# Patient Record
Sex: Female | Born: 1980 | Race: White | Hispanic: No | Smoking: Never smoker
Health system: Southern US, Community
[De-identification: ages and names within clinical notes are randomized; demographics above are authoritative.]

## PROBLEM LIST (undated history)

## (undated) DIAGNOSIS — T8859XA Other complications of anesthesia, initial encounter: Secondary | ICD-10-CM

## (undated) DIAGNOSIS — O021 Missed abortion: Secondary | ICD-10-CM

## (undated) DIAGNOSIS — O99345 Other mental disorders complicating the puerperium: Secondary | ICD-10-CM

## (undated) DIAGNOSIS — T4145XA Adverse effect of unspecified anesthetic, initial encounter: Secondary | ICD-10-CM

## (undated) DIAGNOSIS — Z789 Other specified health status: Secondary | ICD-10-CM

## (undated) DIAGNOSIS — F418 Other specified anxiety disorders: Secondary | ICD-10-CM

---

## 2010-12-14 ENCOUNTER — Other Ambulatory Visit: Payer: Self-pay | Admitting: Specialist

## 2010-12-14 ENCOUNTER — Ambulatory Visit
Admission: RE | Admit: 2010-12-14 | Discharge: 2010-12-14 | Disposition: A | Discharge status: Home / Self Care | Payer: Self-pay | Source: Ambulatory Visit | Attending: Specialist | Admitting: Specialist

## 2010-12-14 DIAGNOSIS — R6889 Other general symptoms and signs: Secondary | ICD-10-CM

## 2012-02-07 IMAGING — CR DG CHEST 1V
1 series · 1 of 1 positions shown · non-contrast
Comparison: None.

CLINICAL DATA: Positive TB test.

CHEST - 1 VIEW

[view not recorded]
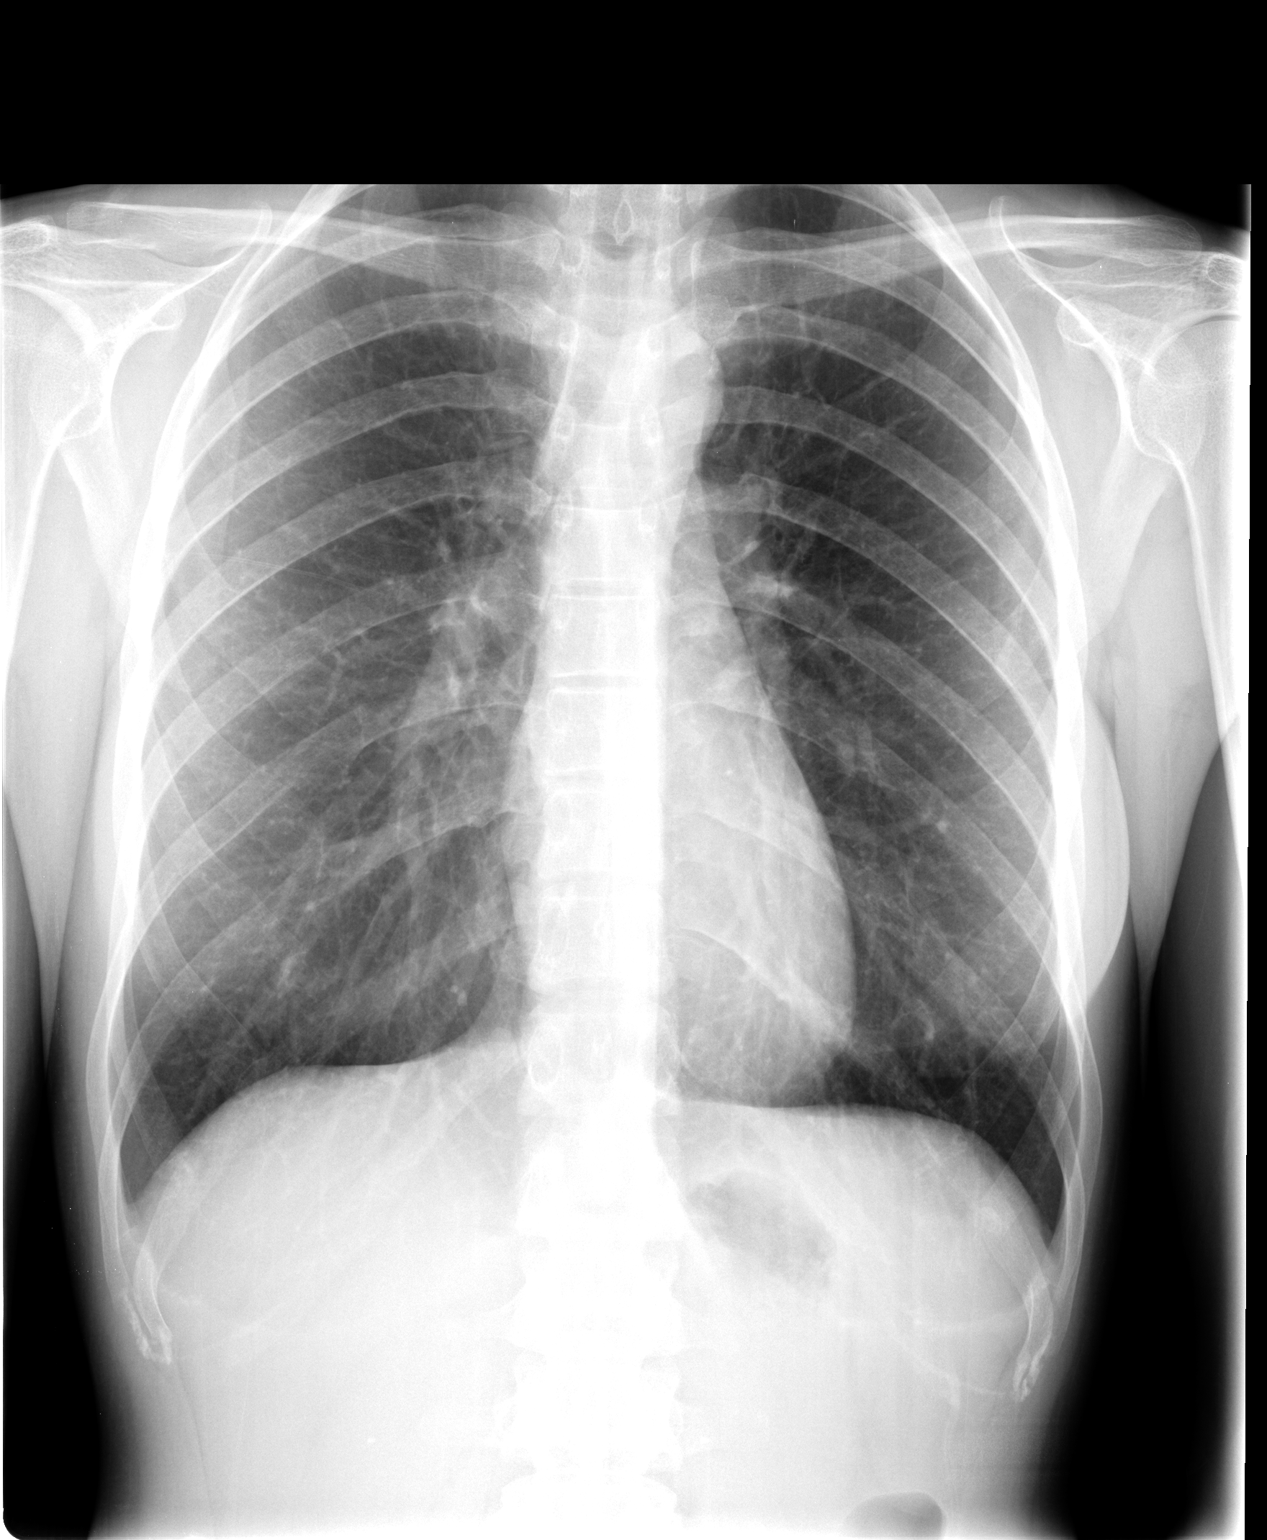

[1 of 1 positions shown; findings below may reference images not displayed]

FINDINGS: Trachea is midline.  Heart size normal.  Lungs are clear.
No pleural fluid.
IMPRESSION: No acute findings.  No evidence of active tuberculosis.

## 2013-04-15 DIAGNOSIS — Z01419 Encounter for gynecological examination (general) (routine) without abnormal findings: Secondary | ICD-10-CM | POA: Insufficient documentation

## 2013-10-28 NOTE — H&P (Signed)
Rachel Berry is an 33 y.o. femaleG1P0 at 9+ weeks by LMP with unfortunate finding of a missed abortion on her US in the last week. Fetal crown rump length c/Berry an 8 week size but no FHM seen.  Pt tried cytotec x 1, but did not work.  HAd some bleeding and cramping but f/u US showed entire sac still present.  She now wishes to rpoceed with D&E. Pertinent Gynecological History: none  Menstrual History:  No LMP recorded.    No past medical history on file.  No past surgical history on file.  No family history on file.  Social History:  has no tobacco, alcohol, and drug history on file.  Allergies: Allergies not on file  No prescriptions prior to admission    Review of Systems  Gastrointestinal: Negative for abdominal pain.    There were no vitals taken for this visit. Physical Exam  Constitutional: She is oriented to person, place, and time. She appears well-developed and well-nourished.  Cardiovascular: Normal rate and regular rhythm.   Respiratory: Effort normal.  GI: Soft.  Genitourinary: Vagina normal.  Uterus 9 week size  Neurological: She is alert and oriented to person, place, and time.  Psychiatric: She has a normal mood and affect.    No results found for this or any previous visit (from the past 24 hour(s)).  No results found.  Assessment/Plan: We discussed her options going forward with expectant management, cytotec induced bleeding, and D&C. The risks and benefits of each approach were reviewed and the patient elects to schedule a D&C. The risks of the procedure were reviewed with her in detail including bleeding, infection, and possible uterine perforation. She will be prescribed 400mcg of cytotec to moisten and insert vaginally 3 hours prior to the procedure to aid with cervical dilation. Her blood type will be confirmed and the procedure scheduled. Blood type is A positive per a card from [place], will confirm.  Rachel Berry 10/28/2013, 8:44 PM

## 2013-10-29 ENCOUNTER — Encounter (HOSPITAL_BASED_OUTPATIENT_CLINIC_OR_DEPARTMENT_OTHER): Payer: Self-pay | Admitting: *Deleted

## 2013-10-29 NOTE — Progress Notes (Signed)
Pt instructed npo p mn tonight .  To [facility] 6/18 @ 0545.  Needs cbc, ABO/RH on arrival.pt 's preferred language is [place].  Has some difficulty understanding.  Offered interpreter, pt refused.

## 2013-10-30 ENCOUNTER — Encounter (HOSPITAL_BASED_OUTPATIENT_CLINIC_OR_DEPARTMENT_OTHER): Payer: 59 | Admitting: Anesthesiology

## 2013-10-30 ENCOUNTER — Encounter (HOSPITAL_BASED_OUTPATIENT_CLINIC_OR_DEPARTMENT_OTHER): Payer: Self-pay | Admitting: *Deleted

## 2013-10-30 ENCOUNTER — Ambulatory Visit (HOSPITAL_BASED_OUTPATIENT_CLINIC_OR_DEPARTMENT_OTHER): Payer: 59 | Admitting: Anesthesiology

## 2013-10-30 ENCOUNTER — Encounter (HOSPITAL_BASED_OUTPATIENT_CLINIC_OR_DEPARTMENT_OTHER)
Admission: RE | Disposition: A | Discharge status: Home / Self Care | Payer: Self-pay | Source: Ambulatory Visit | Attending: Obstetrics and Gynecology

## 2013-10-30 ENCOUNTER — Ambulatory Visit (HOSPITAL_BASED_OUTPATIENT_CLINIC_OR_DEPARTMENT_OTHER)
Admission: RE | Admit: 2013-10-30 | Discharge: 2013-10-30 | Disposition: A | Discharge status: Home / Self Care | Payer: 59 | Source: Ambulatory Visit | Attending: Obstetrics and Gynecology | Admitting: Obstetrics and Gynecology

## 2013-10-30 DIAGNOSIS — O021 Missed abortion: Secondary | ICD-10-CM | POA: Insufficient documentation

## 2013-10-30 HISTORY — PX: DILATION AND CURETTAGE OF UTERUS: SHX78

## 2013-10-30 HISTORY — DX: Missed abortion: O02.1

## 2013-10-30 LAB — CBC
HCT: 36.7 % (ref 36.0–46.0)
Hemoglobin: 13.2 g/dL (ref 12.0–15.0)
MCH: 31.3 pg (ref 26.0–34.0)
MCHC: 36 g/dL (ref 30.0–36.0)
MCV: 87 fL (ref 78.0–100.0)
Platelets: 249 10*3/uL (ref 150–400)
RBC: 4.22 MIL/uL (ref 3.87–5.11)
RDW: 12.4 % (ref 11.5–15.5)
WBC: 7 10*3/uL (ref 4.0–10.5)

## 2013-10-30 LAB — ABO/RH: ABO/RH(D): A POS

## 2013-10-30 SURGERY — DILATION AND CURETTAGE
Anesthesia: Monitor Anesthesia Care | Site: Vagina

## 2013-10-30 MED ORDER — PROPOFOL 10 MG/ML IV BOLUS
INTRAVENOUS | Status: DC | PRN
  Administered 2013-10-30: 20 mg via INTRAVENOUS

## 2013-10-30 MED ORDER — IBUPROFEN 200 MG PO TABS: 600.0000 mg | ORAL_TABLET | Freq: Four times a day (QID) | ORAL | Status: DC | PRN

## 2013-10-30 MED ORDER — KETOROLAC TROMETHAMINE 30 MG/ML IJ SOLN
INTRAMUSCULAR | Status: DC | PRN
  Administered 2013-10-30: 30 mg via INTRAVENOUS

## 2013-10-30 MED ORDER — PROMETHAZINE HCL 25 MG/ML IJ SOLN
6.2500 mg | INTRAMUSCULAR | Status: DC | PRN
  Filled 2013-10-30: qty 1

## 2013-10-30 MED ORDER — LACTATED RINGERS IV SOLN
INTRAVENOUS | Status: DC | PRN
  Administered 2013-10-30 (×2): via INTRAVENOUS

## 2013-10-30 MED ORDER — LACTATED RINGERS IV SOLN
INTRAVENOUS | Status: DC
  Administered 2013-10-30: 07:00:00 via INTRAVENOUS
  Filled 2013-10-30: qty 1000

## 2013-10-30 MED ORDER — PROPOFOL 10 MG/ML IV EMUL
INTRAVENOUS | Status: DC | PRN
  Administered 2013-10-30: 75 ug/kg/min via INTRAVENOUS

## 2013-10-30 MED ORDER — FENTANYL CITRATE 0.05 MG/ML IJ SOLN
INTRAMUSCULAR | Status: AC
  Filled 2013-10-30: qty 6

## 2013-10-30 MED ORDER — LIDOCAINE HCL 1 % IJ SOLN
INTRAMUSCULAR | Status: DC | PRN
  Administered 2013-10-30: 20 mL

## 2013-10-30 MED ORDER — ONDANSETRON HCL 4 MG/2ML IJ SOLN
INTRAMUSCULAR | Status: DC | PRN
  Administered 2013-10-30: 4 mg via INTRAVENOUS

## 2013-10-30 MED ORDER — MIDAZOLAM HCL 2 MG/2ML IJ SOLN
INTRAMUSCULAR | Status: AC
  Filled 2013-10-30: qty 4

## 2013-10-30 MED ORDER — FENTANYL CITRATE 0.05 MG/ML IJ SOLN
25.0000 ug | INTRAMUSCULAR | Status: DC | PRN
  Filled 2013-10-30: qty 1

## 2013-10-30 MED ORDER — MIDAZOLAM HCL 5 MG/5ML IJ SOLN
INTRAMUSCULAR | Status: DC | PRN
  Administered 2013-10-30 (×3): 1 mg via INTRAVENOUS
  Administered 2013-10-30 (×2): 0.5 mg via INTRAVENOUS

## 2013-10-30 MED ORDER — LIDOCAINE HCL (CARDIAC) 20 MG/ML IV SOLN
INTRAVENOUS | Status: DC | PRN
  Administered 2013-10-30: 40 mg via INTRAVENOUS

## 2013-10-30 MED ORDER — DEXAMETHASONE SODIUM PHOSPHATE 4 MG/ML IJ SOLN
INTRAMUSCULAR | Status: DC | PRN
  Administered 2013-10-30: 5 mg via INTRAVENOUS

## 2013-10-30 MED ORDER — FENTANYL CITRATE 0.05 MG/ML IJ SOLN
INTRAMUSCULAR | Status: DC | PRN
  Administered 2013-10-30 (×4): 25 ug via INTRAVENOUS

## 2013-10-30 MED ORDER — STERILE WATER FOR IRRIGATION IR SOLN
Status: DC | PRN
  Administered 2013-10-30: 500 mL

## 2013-10-30 MED ORDER — KETOROLAC TROMETHAMINE 30 MG/ML IJ SOLN
15.0000 mg | Freq: Once | INTRAMUSCULAR | Status: DC | PRN
  Filled 2013-10-30: qty 1

## 2013-10-30 SURGICAL SUPPLY — 26 items
CATH ROBINSON RED A/P 16FR (CATHETERS) ×3 IMPLANT
COVER TABLE BACK 60X90 (DRAPES) ×3 IMPLANT
DRAPE LG THREE QUARTER DISP (DRAPES) ×3 IMPLANT
DRAPE UNDERBUTTOCKS STRL (DRAPE) ×3 IMPLANT
DRSG TELFA 3X8 NADH (GAUZE/BANDAGES/DRESSINGS) ×3 IMPLANT
GLOVE BIO SURGEON STRL SZ 6.5 (GLOVE) ×2 IMPLANT
GLOVE BIO SURGEONS STRL SZ 6.5 (GLOVE) ×1
GLOVE BIOGEL PI IND STRL 7.5 (GLOVE) ×1 IMPLANT
GLOVE BIOGEL PI INDICATOR 7.5 (GLOVE) ×2
GLOVE INDICATOR 7.5 STRL GRN (GLOVE) ×3 IMPLANT
GOWN STRL REUS W/ TWL LRG LVL3 (GOWN DISPOSABLE) ×1 IMPLANT
GOWN STRL REUS W/TWL LRG LVL3 (GOWN DISPOSABLE) ×8 IMPLANT
LEGGING LITHOTOMY PAIR STRL (DRAPES) ×3 IMPLANT
NEEDLE SPNL 22GX3.5 QUINCKE BK (NEEDLE) ×3 IMPLANT
PACK BASIN DAY SURGERY FS (CUSTOM PROCEDURE TRAY) ×3 IMPLANT
PAD OB MATERNITY 4.3X12.25 (PERSONAL CARE ITEMS) ×3 IMPLANT
PAD PREP 24X48 CUFFED NSTRL (MISCELLANEOUS) ×3 IMPLANT
SET BERKELEY SUCTION TUBING (SUCTIONS) IMPLANT
SYR CONTROL 10ML LL (SYRINGE) ×3 IMPLANT
TOP DISP BERKELEY (MISCELLANEOUS) IMPLANT
TOWEL OR 17X24 6PK STRL BLUE (TOWEL DISPOSABLE) ×3 IMPLANT
TRAY DSU PREP LF (CUSTOM PROCEDURE TRAY) ×3 IMPLANT
TUBE CONNECTING 12'X1/4 (SUCTIONS)
TUBE CONNECTING 12X1/4 (SUCTIONS) IMPLANT
VACURETTE 7MM CVD STRL WRAP (CANNULA) ×3 IMPLANT
WATER STERILE IRR 500ML POUR (IV SOLUTION) ×3 IMPLANT

## 2013-10-30 NOTE — Anesthesia Preprocedure Evaluation (Signed)
Anesthesia Evaluation  Patient identified by MRN, date of birth, ID band Patient awake    Reviewed: Allergy & Precautions, H&P , NPO status , Patient's Chart, lab work & pertinent test results  Airway Mallampati: II TM Distance: >3 FB Neck ROM: Full    Dental no notable dental hx.    Pulmonary neg pulmonary ROS,  breath sounds clear to auscultation  Pulmonary exam normal       Cardiovascular negative cardio ROS  Rhythm:Regular Rate:Normal     Neuro/Psych negative neurological ROS  negative psych ROS   GI/Hepatic negative GI ROS, Neg liver ROS,   Endo/Other  negative endocrine ROS  Renal/GU negative Renal ROS  negative genitourinary   Musculoskeletal negative musculoskeletal ROS (+)   Abdominal   Peds negative pediatric ROS (+)  Hematology negative hematology ROS (+)   Anesthesia Other Findings   Reproductive/Obstetrics negative OB ROS                           Anesthesia Physical Anesthesia Plan  ASA: I  Anesthesia Plan: MAC   Post-op Pain Management:    Induction: Intravenous  Airway Management Planned: Simple Face Mask  Additional Equipment:   Intra-op Plan:   Post-operative Plan:   Informed Consent: I have reviewed the patients History and Physical, chart, labs and discussed the procedure including the risks, benefits and alternatives for the proposed anesthesia with the patient or authorized representative who has indicated his/her understanding and acceptance.   Dental advisory given  Plan Discussed with: CRNA and Surgeon  Anesthesia Plan Comments:         Anesthesia Quick Evaluation  

## 2013-10-30 NOTE — Procedures (Signed)
Operative Note  Pre-operative Diagnosis Missed abortion at 8+weeks size  Postoperattive Diagnosis Same  Procedure Suction Dilation and Evacuation  Surgeon Huel CoteKathy Richardson, MD  Anesthesia MAC and paracervical block  Findings The uterus was 8 weeks size with moderate amount POC's  EBL 50cc IVF 500cc UOP 50cc straight cath prior to procedure  Specimen  POC's sent  Procedure Note PT was taken to the operating room where MAC anesthesia was obtained without difficulty.  An appropriate timeout was performed.  She was prepped and draped in the normal sterile fashion in the dorsal lithotomy position.  The cervix was injectied on the anterior lip and at 2 and 10 o'clock with a total of 20cc of 1% plain lidocine.  THe uterus was sounded to 7-8 cm.  The pratt dilators were easily utilized due to the patiant's pre-operative cytotec to dilate the cervix to 23.  The 7 mm suction curette was then placed in the uterine fundus and over several passes a moderate amount of tissue obtained.  Suction was discontinued and then a gentle curettage performed with a small amount of tissue noted.  One additional pass was performed with the suction and when no further tissue was noted the procedure was discontinued.  Te tenaculum was removed and hemostasis noted.  All instrument and sponge counts were corrrect and the patient was taken to the Recovery Room in good condition.

## 2013-10-30 NOTE — Transfer of Care (Signed)
Immediate Anesthesia Transfer of Care Note  Patient: Rachel Berry  Procedure(s) Performed: Procedure(s) (LRB): DILATATION AND CURETTAGE (N/A)  Patient Location: PACU  Anesthesia Type: MAC  Level of Consciousness: awake, sedated, patient cooperative and responds to stimulation  Airway & Oxygen Therapy: Patient Spontanous Breathing and Patient connected to face mask oxygen  Post-op Assessment: Report given to PACU RN, Post -op Vital signs reviewed and stable and Patient moving all extremities  Post vital signs: Reviewed and stable  Complications: No apparent anesthesia complications

## 2013-10-30 NOTE — Anesthesia Postprocedure Evaluation (Signed)
  Anesthesia Post-op Note  Patient: Roxy MannsAnne Millan  Procedure(s) Performed: Procedure(s) (LRB): DILATATION AND CURETTAGE (N/A)  Patient Location: PACU  Anesthesia Type: MAC  Level of Consciousness: awake and alert   Airway and Oxygen Therapy: Patient Spontanous Breathing  Post-op Pain: mild  Post-op Assessment: Post-op Vital signs reviewed, Patient's Cardiovascular Status Stable, Respiratory Function Stable, Patent Airway and No signs of Nausea or vomiting  Last Vitals:  Filed Vitals:   10/30/13 0757  BP: 91/53  Pulse: 62  Temp: 36.8 C  Resp: 10    Post-op Vital Signs: stable   Complications: No apparent anesthesia complications

## 2013-10-30 NOTE — Discharge Instructions (Signed)

## 2013-10-30 NOTE — Progress Notes (Signed)
Patient ID: Rachel Berry, female   DOB: 01-15-1981, 33 y.o.   MRN: [id number] Per pt no changes in dictated H&P brief exam WNL.

## 2013-10-31 ENCOUNTER — Encounter (HOSPITAL_BASED_OUTPATIENT_CLINIC_OR_DEPARTMENT_OTHER): Payer: Self-pay | Admitting: Obstetrics and Gynecology

## 2014-09-24 LAB — OB RESULTS CONSOLE HEPATITIS B SURFACE ANTIGEN: Hepatitis B Surface Ag: NEGATIVE

## 2014-09-24 LAB — OB RESULTS CONSOLE GC/CHLAMYDIA
Chlamydia: NEGATIVE
Gonorrhea: NEGATIVE

## 2014-09-24 LAB — OB RESULTS CONSOLE HIV ANTIBODY (ROUTINE TESTING): HIV: NONREACTIVE

## 2014-09-24 LAB — OB RESULTS CONSOLE RPR
RPR: NONREACTIVE
RPR: NONREACTIVE

## 2014-09-24 LAB — OB RESULTS CONSOLE RUBELLA ANTIBODY, IGM: Rubella: IMMUNE

## 2014-10-01 ENCOUNTER — Inpatient Hospital Stay (HOSPITAL_COMMUNITY): Admission: AD | Admit: 2014-10-01 | Payer: Self-pay | Source: Ambulatory Visit | Admitting: Obstetrics and Gynecology

## 2014-11-18 LAB — OB RESULTS CONSOLE GBS: GBS: NEGATIVE

## 2014-12-18 ENCOUNTER — Inpatient Hospital Stay (HOSPITAL_COMMUNITY)
Admission: AD | Admit: 2014-12-18 | Discharge: 2014-12-22 | DRG: 766 | Disposition: A | Discharge status: Home / Self Care | Payer: 59 | Source: Ambulatory Visit | Attending: Obstetrics and Gynecology | Admitting: Obstetrics and Gynecology

## 2014-12-18 ENCOUNTER — Encounter (HOSPITAL_COMMUNITY): Payer: Self-pay

## 2014-12-18 ENCOUNTER — Inpatient Hospital Stay (HOSPITAL_COMMUNITY): Payer: 59 | Admitting: Anesthesiology

## 2014-12-18 DIAGNOSIS — O324XX0 Maternal care for high head at term, not applicable or unspecified: Secondary | ICD-10-CM | POA: Diagnosis present

## 2014-12-18 DIAGNOSIS — Z3A39 39 weeks gestation of pregnancy: Secondary | ICD-10-CM | POA: Diagnosis present

## 2014-12-18 DIAGNOSIS — IMO0001 Reserved for inherently not codable concepts without codable children: Secondary | ICD-10-CM

## 2014-12-18 LAB — CBC
HCT: 37 % (ref 36.0–46.0)
Hemoglobin: 13 g/dL (ref 12.0–15.0)
MCH: 30.8 pg (ref 26.0–34.0)
MCHC: 35.1 g/dL (ref 30.0–36.0)
MCV: 87.7 fL (ref 78.0–100.0)
Platelets: 277 10*3/uL (ref 150–400)
RBC: 4.22 MIL/uL (ref 3.87–5.11)
RDW: 12.9 % (ref 11.5–15.5)
WBC: 19 10*3/uL — ABNORMAL HIGH (ref 4.0–10.5)

## 2014-12-18 MED ORDER — CITRIC ACID-SODIUM CITRATE 334-500 MG/5ML PO SOLN
30.0000 mL | ORAL | Status: DC | PRN
  Administered 2014-12-19: 30 mL via ORAL
  Filled 2014-12-18: qty 15

## 2014-12-18 MED ORDER — PHENYLEPHRINE 40 MCG/ML (10ML) SYRINGE FOR IV PUSH (FOR BLOOD PRESSURE SUPPORT)
80.0000 ug | PREFILLED_SYRINGE | INTRAVENOUS | Status: DC | PRN
  Filled 2014-12-18: qty 20

## 2014-12-18 MED ORDER — ACETAMINOPHEN 325 MG PO TABS: 650.0000 mg | ORAL_TABLET | ORAL | Status: DC | PRN

## 2014-12-18 MED ORDER — ONDANSETRON HCL 4 MG/2ML IJ SOLN: 4.0000 mg | Freq: Four times a day (QID) | INTRAMUSCULAR | Status: DC | PRN

## 2014-12-18 MED ORDER — EPHEDRINE 5 MG/ML INJ: 10.0000 mg | INTRAVENOUS | Status: DC | PRN

## 2014-12-18 MED ORDER — OXYTOCIN BOLUS FROM INFUSION: 500.0000 mL | INTRAVENOUS | Status: DC

## 2014-12-18 MED ORDER — DIPHENHYDRAMINE HCL 50 MG/ML IJ SOLN: 12.5000 mg | INTRAMUSCULAR | Status: DC | PRN

## 2014-12-18 MED ORDER — OXYCODONE-ACETAMINOPHEN 5-325 MG PO TABS: 2.0000 | ORAL_TABLET | ORAL | Status: DC | PRN

## 2014-12-18 MED ORDER — LIDOCAINE HCL (PF) 1 % IJ SOLN
INTRAMUSCULAR | Status: DC | PRN
  Administered 2014-12-18: 3 mL
  Administered 2014-12-18 (×2): 5 mL

## 2014-12-18 MED ORDER — LACTATED RINGERS IV SOLN: 500.0000 mL | INTRAVENOUS | Status: DC | PRN

## 2014-12-18 MED ORDER — OXYCODONE-ACETAMINOPHEN 5-325 MG PO TABS: 1.0000 | ORAL_TABLET | ORAL | Status: DC | PRN

## 2014-12-18 MED ORDER — LIDOCAINE HCL (PF) 1 % IJ SOLN
30.0000 mL | INTRAMUSCULAR | Status: DC | PRN
  Filled 2014-12-18: qty 30

## 2014-12-18 MED ORDER — FENTANYL 2.5 MCG/ML BUPIVACAINE 1/10 % EPIDURAL INFUSION (WH - ANES)
14.0000 mL/h | INTRAMUSCULAR | Status: DC | PRN
  Administered 2014-12-18 – 2014-12-19 (×4): 14 mL/h via EPIDURAL
  Filled 2014-12-18 (×3): qty 125

## 2014-12-18 MED ORDER — OXYTOCIN 40 UNITS IN LACTATED RINGERS INFUSION - SIMPLE MED: 62.5000 mL/h | INTRAVENOUS | Status: DC

## 2014-12-18 MED ORDER — LACTATED RINGERS IV SOLN
INTRAVENOUS | Status: DC
  Administered 2014-12-19 (×2): via INTRAVENOUS

## 2014-12-18 NOTE — MAU Note (Signed)
Ctx all afternin 3cm in office. Reports loss mucus plug. No bleeding not leaking reports l;losts of pelvic pressure

## 2014-12-18 NOTE — H&P (Signed)
Rachel Berry is a 34 y.o. G67P0010 female presenting for active labor. Pt received initial prenatal care in [place]. Started prenatal care in [place] at [facility]. Maternal Medical History:  Reason for admission: Contractions.  Nausea.  Contractions: Onset was 6-12 hours ago.   Frequency: irregular.   Duration is approximately 5 minutes.   Perceived severity is moderate.    Fetal activity: Perceived fetal activity is normal.   Last perceived fetal movement was within the past hour.    Prenatal complications: no prenatal complications Prenatal Complications - Diabetes: none.    OB History    Gravida Para Term Preterm AB TAB SAB Ectopic Multiple Living   1              Past Medical History  Diagnosis Date  . Missed ab    Past Surgical History  Procedure Laterality Date  . Multiple tooth extractions    . Dilation and curettage of uterus N/A 10/30/2013    Procedure: DILATATION AND CURETTAGE;  Surgeon: Oliver Pila, MD;  Location: [facility];  Service: Gynecology;  Laterality: N/A;   Family History: family history is not on file. Social History:  reports that she has never smoked. She does not have any smokeless tobacco history on file. She reports that she does not drink alcohol. Her drug history is not on file.   Prenatal Transfer Tool  Maternal Diabetes: No Genetic Screening: Normal Maternal Ultrasounds/Referrals: Normal Fetal Ultrasounds or other Referrals:  None Maternal Substance Abuse:  No Significant Maternal Medications:  None Significant Maternal Lab Results:  None Other Comments:  None  Review of Systems  Constitutional: Positive for malaise/fatigue.  Cardiovascular: Negative for chest pain.  Gastrointestinal: Positive for abdominal pain. Negative for nausea and vomiting.  Musculoskeletal: Positive for back pain.  Neurological: Negative for dizziness.  Psychiatric/Behavioral: The patient is nervous/anxious.     Dilation: 5 Effacement (%):  80 Station: -2 Exam by:: K.Wilson,RN Blood pressure 133/65, resp. rate 113. Maternal Exam:  Uterine Assessment: Contraction strength is moderate.  Contraction duration is 5 minutes. Contraction frequency is irregular.   Abdomen: Patient reports generalized tenderness.  Fetal presentation: vertex  Introitus: Normal vulva. Normal vagina.  Ferning test: not done.  Nitrazine test: not done.  Pelvis: adequate for delivery.   Cervix: Cervix evaluated by digital exam.     Physical Exam  Constitutional: She is oriented to person, place, and time. She appears well-developed and well-nourished.  HENT:  Head: Normocephalic.  Neck: Normal range of motion.  Cardiovascular: Normal rate.   Respiratory: Effort normal.  GI: Soft. There is generalized tenderness.  Genitourinary: Vagina normal and uterus normal.  Neurological: She is alert and oriented to person, place, and time.  Psychiatric: She has a normal mood and affect. Her behavior is normal. Judgment and thought content normal.    Prenatal labs: ABO, Rh:   Antibody:   Rubella: Immune (05/12 0000) RPR: Nonreactive, Nonreactive (05/12 0000)  HBsAg: Negative (05/12 0000)  HIV: Non-reactive (05/12 0000)  GBS: Negative (07/06 0000)   Assessment/Plan: 34yo G2P0010 at 39 6/7wks in active labor. GBS neg    Rachel Berry Kule Gascoigne 12/18/2014, 10:27 PM

## 2014-12-18 NOTE — Anesthesia Preprocedure Evaluation (Signed)
Anesthesia Evaluation  Patient identified by MRN, date of birth, ID band Patient awake    Reviewed: Allergy & Precautions, H&P , NPO status , Patient's Chart, lab work & pertinent test results  History of Anesthesia Complications Negative for: history of anesthetic complications  Airway Mallampati: II  TM Distance: >3 FB Neck ROM: full    Dental no notable dental hx. (+) Teeth Intact   Pulmonary neg pulmonary ROS,  breath sounds clear to auscultation  Pulmonary exam normal       Cardiovascular negative cardio ROS Normal cardiovascular examRhythm:regular Rate:Normal     Neuro/Psych negative neurological ROS  negative psych ROS   GI/Hepatic negative GI ROS, Neg liver ROS,   Endo/Other  negative endocrine ROS  Renal/GU negative Renal ROS  negative genitourinary   Musculoskeletal   Abdominal   Peds  Hematology negative hematology ROS (+)   Anesthesia Other Findings   Reproductive/Obstetrics (+) Pregnancy                             Anesthesia Physical Anesthesia Plan  ASA: II  Anesthesia Plan: Epidural   Post-op Pain Management:    Induction:   Airway Management Planned:   Additional Equipment:   Intra-op Plan:   Post-operative Plan:   Informed Consent: I have reviewed the patients History and Physical, chart, labs and discussed the procedure including the risks, benefits and alternatives for the proposed anesthesia with the patient or authorized representative who has indicated his/her understanding and acceptance.     Plan Discussed with:   Anesthesia Plan Comments:         Anesthesia Quick Evaluation  

## 2014-12-18 NOTE — MAU Note (Signed)
Annabelle Harman RN in [facility] given report. Pt may go to room 164

## 2014-12-18 NOTE — Anesthesia Procedure Notes (Signed)
Epidural Patient location during procedure: OB  Staffing Anesthesiologist: Annagrace Carr Performed by: anesthesiologist   Preanesthetic Checklist Completed: patient identified, site marked, surgical consent, pre-op evaluation, timeout performed, IV checked, risks and benefits discussed and monitors and equipment checked  Epidural Patient position: sitting Prep: DuraPrep Patient monitoring: heart rate, continuous pulse ox and blood pressure Approach: right paramedian Location: L3-L4 Injection technique: LOR saline  Needle:  Needle type: Tuohy  Needle gauge: 17 G Needle length: 9 cm and 9 Needle insertion depth: 4 cm Catheter type: closed end flexible Catheter size: 20 Guage Catheter at skin depth: 9 cm Test dose: negative  Assessment Events: blood not aspirated, injection not painful, no injection resistance, negative IV test and no paresthesia  Additional Notes Patient identified. Risks/Benefits/Options discussed with patient including but not limited to bleeding, infection, nerve damage, paralysis, failed block, incomplete pain control, headache, blood pressure changes, nausea, vomiting, reactions to medication both or allergic, itching and postpartum back pain. Confirmed with bedside nurse the patient's most recent platelet count. Confirmed with patient that they are not currently taking any anticoagulation, have any bleeding history or any family history of bleeding disorders. Patient expressed understanding and wished to proceed. All questions were answered. Sterile technique was used throughout the entire procedure. Please see nursing notes for vital signs. Test dose was given through epidural needle and negative prior to continuing to dose epidural or start infusion. Warning signs of high block given to the patient including shortness of breath, tingling/numbness in hands, complete motor block, or any concerning symptoms with instructions to call for help. Patient was given  instructions on fall risk and not to get out of bed. All questions and concerns addressed with instructions to call with any issues.   

## 2014-12-19 ENCOUNTER — Encounter (HOSPITAL_COMMUNITY): Payer: Self-pay | Admitting: *Deleted

## 2014-12-19 ENCOUNTER — Encounter (HOSPITAL_COMMUNITY)
Admission: AD | Disposition: A | Discharge status: Home / Self Care | Payer: Self-pay | Source: Ambulatory Visit | Attending: Obstetrics and Gynecology

## 2014-12-19 LAB — ABO/RH: ABO/RH(D): A POS

## 2014-12-19 LAB — RPR: RPR Ser Ql: NONREACTIVE

## 2014-12-19 LAB — HIV ANTIBODY (ROUTINE TESTING W REFLEX): HIV SCREEN 4TH GENERATION: NONREACTIVE

## 2014-12-19 LAB — TYPE AND SCREEN
ABO/RH(D): A POS
Antibody Screen: NEGATIVE

## 2014-12-19 SURGERY — Surgical Case
Anesthesia: Epidural | Site: Abdomen

## 2014-12-19 MED ORDER — DIPHENHYDRAMINE HCL 25 MG PO CAPS
25.0000 mg | ORAL_CAPSULE | Freq: Four times a day (QID) | ORAL | Status: DC | PRN
Start: 2014-12-19 — End: 2014-12-22

## 2014-12-19 MED ORDER — ONDANSETRON HCL 4 MG/2ML IJ SOLN: 4.0000 mg | Freq: Once | INTRAMUSCULAR | Status: DC | PRN

## 2014-12-19 MED ORDER — ZOLPIDEM TARTRATE 5 MG PO TABS: 5.0000 mg | ORAL_TABLET | Freq: Every evening | ORAL | Status: DC | PRN

## 2014-12-19 MED ORDER — LACTATED RINGERS IV SOLN
INTRAVENOUS | Status: DC
  Administered 2014-12-20 (×2): via INTRAVENOUS

## 2014-12-19 MED ORDER — SENNOSIDES-DOCUSATE SODIUM 8.6-50 MG PO TABS
2.0000 | ORAL_TABLET | ORAL | Status: DC
  Administered 2014-12-20 – 2014-12-22 (×3): 2 via ORAL
  Filled 2014-12-19 (×3): qty 2

## 2014-12-19 MED ORDER — ONDANSETRON HCL 4 MG/2ML IJ SOLN
INTRAMUSCULAR | Status: DC | PRN
  Administered 2014-12-19: 4 mg via INTRAVENOUS

## 2014-12-19 MED ORDER — SCOPOLAMINE 1 MG/3DAYS TD PT72
MEDICATED_PATCH | TRANSDERMAL | Status: AC
  Administered 2014-12-19: 1.5 mg via TRANSDERMAL
  Filled 2014-12-19: qty 1

## 2014-12-19 MED ORDER — DIBUCAINE 1 % RE OINT: 1.0000 "application " | TOPICAL_OINTMENT | RECTAL | Status: DC | PRN

## 2014-12-19 MED ORDER — MIDAZOLAM HCL 2 MG/2ML IJ SOLN
INTRAMUSCULAR | Status: DC | PRN
  Administered 2014-12-19: 2 mg via INTRAVENOUS

## 2014-12-19 MED ORDER — LIDOCAINE-EPINEPHRINE 2 %-1:100000 IJ SOLN
INTRAMUSCULAR | Status: DC | PRN
  Administered 2014-12-19 (×3): 5 mL via INTRADERMAL

## 2014-12-19 MED ORDER — OXYTOCIN 10 UNIT/ML IJ SOLN
INTRAMUSCULAR | Status: AC
  Filled 2014-12-19: qty 4

## 2014-12-19 MED ORDER — OXYTOCIN 40 UNITS IN LACTATED RINGERS INFUSION - SIMPLE MED: 62.5000 mL/h | INTRAVENOUS | Status: AC

## 2014-12-19 MED ORDER — FENTANYL CITRATE (PF) 100 MCG/2ML IJ SOLN
INTRAMUSCULAR | Status: DC | PRN
  Administered 2014-12-19 (×2): 50 ug via INTRAVENOUS

## 2014-12-19 MED ORDER — KETOROLAC TROMETHAMINE 30 MG/ML IJ SOLN
30.0000 mg | Freq: Four times a day (QID) | INTRAMUSCULAR | Status: AC | PRN
  Administered 2014-12-19 – 2014-12-20 (×3): 30 mg via INTRAVENOUS
  Filled 2014-12-19 (×2): qty 1

## 2014-12-19 MED ORDER — MORPHINE SULFATE 0.5 MG/ML IJ SOLN
INTRAMUSCULAR | Status: AC
  Filled 2014-12-19: qty 100

## 2014-12-19 MED ORDER — DIPHENHYDRAMINE HCL 50 MG/ML IJ SOLN: 12.5000 mg | INTRAMUSCULAR | Status: DC | PRN

## 2014-12-19 MED ORDER — TERBUTALINE SULFATE 1 MG/ML IJ SOLN
0.2500 mg | Freq: Once | INTRAMUSCULAR | Status: DC | PRN
Start: 2014-12-19 — End: 2014-12-19

## 2014-12-19 MED ORDER — SIMETHICONE 80 MG PO CHEW
80.0000 mg | CHEWABLE_TABLET | ORAL | Status: DC | PRN
  Administered 2014-12-20 – 2014-12-21 (×4): 80 mg via ORAL
  Filled 2014-12-19 (×4): qty 1

## 2014-12-19 MED ORDER — PHENYLEPHRINE 40 MCG/ML (10ML) SYRINGE FOR IV PUSH (FOR BLOOD PRESSURE SUPPORT)
PREFILLED_SYRINGE | INTRAVENOUS | Status: AC
  Filled 2014-12-19: qty 10

## 2014-12-19 MED ORDER — OXYTOCIN 10 UNIT/ML IJ SOLN
40.0000 [IU] | INTRAVENOUS | Status: DC | PRN
  Administered 2014-12-19: 40 [IU] via INTRAVENOUS

## 2014-12-19 MED ORDER — DEXTROSE 5 % IV SOLN: 1.0000 ug/kg/h | INTRAVENOUS | Status: DC | PRN

## 2014-12-19 MED ORDER — METHYLERGONOVINE MALEATE 0.2 MG PO TABS: 0.2000 mg | ORAL_TABLET | ORAL | Status: DC | PRN

## 2014-12-19 MED ORDER — MIDAZOLAM HCL 2 MG/2ML IJ SOLN
INTRAMUSCULAR | Status: AC
  Filled 2014-12-19: qty 4

## 2014-12-19 MED ORDER — TETANUS-DIPHTH-ACELL PERTUSSIS 5-2.5-18.5 LF-MCG/0.5 IM SUSP
0.5000 mL | Freq: Once | INTRAMUSCULAR | Status: DC
  Filled 2014-12-19: qty 0.5

## 2014-12-19 MED ORDER — METHYLERGONOVINE MALEATE 0.2 MG/ML IJ SOLN: 0.2000 mg | INTRAMUSCULAR | Status: DC | PRN

## 2014-12-19 MED ORDER — LANOLIN HYDROUS EX OINT: 1.0000 "application " | TOPICAL_OINTMENT | CUTANEOUS | Status: DC | PRN

## 2014-12-19 MED ORDER — OXYCODONE-ACETAMINOPHEN 5-325 MG PO TABS
2.0000 | ORAL_TABLET | ORAL | Status: DC | PRN
  Administered 2014-12-20 – 2014-12-22 (×8): 2 via ORAL
  Filled 2014-12-19 (×9): qty 2

## 2014-12-19 MED ORDER — NALBUPHINE HCL 10 MG/ML IJ SOLN: 5.0000 mg | Freq: Once | INTRAMUSCULAR | Status: AC | PRN

## 2014-12-19 MED ORDER — LACTATED RINGERS IV SOLN
INTRAVENOUS | Status: DC | PRN
  Administered 2014-12-19: 15:00:00 via INTRAVENOUS

## 2014-12-19 MED ORDER — DIPHENHYDRAMINE HCL 25 MG PO CAPS: 25.0000 mg | ORAL_CAPSULE | ORAL | Status: DC | PRN

## 2014-12-19 MED ORDER — FENTANYL CITRATE (PF) 100 MCG/2ML IJ SOLN
INTRAMUSCULAR | Status: AC
  Filled 2014-12-19: qty 4

## 2014-12-19 MED ORDER — SCOPOLAMINE 1 MG/3DAYS TD PT72
1.0000 | MEDICATED_PATCH | Freq: Once | TRANSDERMAL | Status: AC
  Administered 2014-12-19: 1.5 mg via TRANSDERMAL

## 2014-12-19 MED ORDER — PHENYLEPHRINE HCL 10 MG/ML IJ SOLN
INTRAMUSCULAR | Status: DC | PRN
  Administered 2014-12-19 (×5): 40 ug via INTRAVENOUS

## 2014-12-19 MED ORDER — SODIUM CHLORIDE 0.9 % IJ SOLN: 3.0000 mL | INTRAMUSCULAR | Status: DC | PRN

## 2014-12-19 MED ORDER — ONDANSETRON HCL 4 MG/2ML IJ SOLN
INTRAMUSCULAR | Status: AC
  Filled 2014-12-19: qty 2

## 2014-12-19 MED ORDER — FENTANYL CITRATE (PF) 100 MCG/2ML IJ SOLN: 25.0000 ug | INTRAMUSCULAR | Status: DC | PRN

## 2014-12-19 MED ORDER — OXYTOCIN 40 UNITS IN LACTATED RINGERS INFUSION - SIMPLE MED
1.0000 m[IU]/min | INTRAVENOUS | Status: DC
  Administered 2014-12-19: 2 m[IU]/min via INTRAVENOUS
  Filled 2014-12-19: qty 1000

## 2014-12-19 MED ORDER — NALBUPHINE HCL 10 MG/ML IJ SOLN: 5.0000 mg | INTRAMUSCULAR | Status: DC | PRN

## 2014-12-19 MED ORDER — KETOROLAC TROMETHAMINE 30 MG/ML IJ SOLN: 30.0000 mg | Freq: Four times a day (QID) | INTRAMUSCULAR | Status: AC | PRN

## 2014-12-19 MED ORDER — KETOROLAC TROMETHAMINE 30 MG/ML IJ SOLN
INTRAMUSCULAR | Status: AC
  Administered 2014-12-19: 30 mg via INTRAVENOUS
  Filled 2014-12-19: qty 1

## 2014-12-19 MED ORDER — MENTHOL 3 MG MT LOZG: 1.0000 | LOZENGE | OROMUCOSAL | Status: DC | PRN

## 2014-12-19 MED ORDER — WITCH HAZEL-GLYCERIN EX PADS: 1.0000 "application " | MEDICATED_PAD | CUTANEOUS | Status: DC | PRN

## 2014-12-19 MED ORDER — NALOXONE HCL 0.4 MG/ML IJ SOLN: 0.4000 mg | INTRAMUSCULAR | Status: DC | PRN

## 2014-12-19 MED ORDER — MEPERIDINE HCL 25 MG/ML IJ SOLN: 6.2500 mg | INTRAMUSCULAR | Status: DC | PRN

## 2014-12-19 MED ORDER — ONDANSETRON HCL 4 MG/2ML IJ SOLN: 4.0000 mg | Freq: Three times a day (TID) | INTRAMUSCULAR | Status: DC | PRN

## 2014-12-19 MED ORDER — CEFAZOLIN SODIUM-DEXTROSE 2-3 GM-% IV SOLR
INTRAVENOUS | Status: AC
  Filled 2014-12-19: qty 50

## 2014-12-19 MED ORDER — MORPHINE SULFATE (PF) 0.5 MG/ML IJ SOLN
INTRAMUSCULAR | Status: DC | PRN
  Administered 2014-12-19: 4 mg via EPIDURAL

## 2014-12-19 MED ORDER — CEFAZOLIN SODIUM-DEXTROSE 2-3 GM-% IV SOLR
INTRAVENOUS | Status: DC | PRN
  Administered 2014-12-19: 2 g via INTRAVENOUS

## 2014-12-19 MED ORDER — IBUPROFEN 600 MG PO TABS
600.0000 mg | ORAL_TABLET | Freq: Four times a day (QID) | ORAL | Status: DC
  Administered 2014-12-20 – 2014-12-22 (×9): 600 mg via ORAL
  Filled 2014-12-19 (×11): qty 1

## 2014-12-19 MED ORDER — ACETAMINOPHEN 325 MG PO TABS: 650.0000 mg | ORAL_TABLET | ORAL | Status: DC | PRN

## 2014-12-19 MED ORDER — OXYCODONE-ACETAMINOPHEN 5-325 MG PO TABS
1.0000 | ORAL_TABLET | ORAL | Status: DC | PRN
  Administered 2014-12-20: 1 via ORAL

## 2014-12-19 MED ORDER — PRENATAL MULTIVITAMIN CH
1.0000 | ORAL_TABLET | Freq: Every day | ORAL | Status: DC
  Administered 2014-12-20 – 2014-12-22 (×3): 1 via ORAL
  Filled 2014-12-19 (×3): qty 1

## 2014-12-19 SURGICAL SUPPLY — 33 items
BENZOIN TINCTURE PRP APPL 2/3 (GAUZE/BANDAGES/DRESSINGS) IMPLANT
CLAMP CORD UMBIL (MISCELLANEOUS) IMPLANT
CLOSURE WOUND 1/2 X4 (GAUZE/BANDAGES/DRESSINGS)
CLOTH BEACON ORANGE TIMEOUT ST (SAFETY) ×3 IMPLANT
DRAPE C SECTION CLR SCREEN (DRAPES) ×3 IMPLANT
DRAPE SHEET LG 3/4 BI-LAMINATE (DRAPES) IMPLANT
DRSG OPSITE POSTOP 4X10 (GAUZE/BANDAGES/DRESSINGS) ×3 IMPLANT
DURAPREP 26ML APPLICATOR (WOUND CARE) ×3 IMPLANT
ELECT REM PT RETURN 9FT ADLT (ELECTROSURGICAL) ×3
ELECTRODE REM PT RTRN 9FT ADLT (ELECTROSURGICAL) ×1 IMPLANT
EXTRACTOR VACUUM KIWI (MISCELLANEOUS) IMPLANT
GLOVE BIO SURGEON STRL SZ 6.5 (GLOVE) ×2 IMPLANT
GLOVE BIO SURGEONS STRL SZ 6.5 (GLOVE) ×1
GOWN STRL REUS W/TWL LRG LVL3 (GOWN DISPOSABLE) ×6 IMPLANT
KIT ABG SYR 3ML LUER SLIP (SYRINGE) IMPLANT
NEEDLE HYPO 25X5/8 SAFETYGLIDE (NEEDLE) IMPLANT
NS IRRIG 1000ML POUR BTL (IV SOLUTION) ×3 IMPLANT
PACK C SECTION WH (CUSTOM PROCEDURE TRAY) ×3 IMPLANT
PAD OB MATERNITY 4.3X12.25 (PERSONAL CARE ITEMS) ×3 IMPLANT
RTRCTR C-SECT PINK 25CM LRG (MISCELLANEOUS) IMPLANT
STRIP CLOSURE SKIN 1/2X4 (GAUZE/BANDAGES/DRESSINGS) IMPLANT
SUT CHROMIC 1 CTX 36 (SUTURE) ×6 IMPLANT
SUT PLAIN 0 NONE (SUTURE) IMPLANT
SUT PLAIN 2 0 XLH (SUTURE) ×3 IMPLANT
SUT VIC AB 0 CT1 27 (SUTURE) ×4
SUT VIC AB 0 CT1 27XBRD ANBCTR (SUTURE) ×2 IMPLANT
SUT VIC AB 2-0 CT1 27 (SUTURE) ×2
SUT VIC AB 2-0 CT1 TAPERPNT 27 (SUTURE) ×1 IMPLANT
SUT VIC AB 3-0 CT1 27 (SUTURE)
SUT VIC AB 3-0 CT1 TAPERPNT 27 (SUTURE) IMPLANT
SUT VIC AB 4-0 KS 27 (SUTURE) ×3 IMPLANT
TOWEL OR 17X24 6PK STRL BLUE (TOWEL DISPOSABLE) ×3 IMPLANT
TRAY FOLEY CATH SILVER 14FR (SET/KITS/TRAYS/PACK) ×3 IMPLANT

## 2014-12-19 NOTE — Transfer of Care (Signed)
Immediate Anesthesia Transfer of Care Note  Patient: Rachel Berry  Procedure(s) Performed: Procedure(s): CESAREAN SECTION (N/A)  Patient Location: PACU  Anesthesia Type:Epidural  Level of Consciousness: awake and alert   Airway & Oxygen Therapy: Patient Spontanous Breathing  Post-op Assessment: Report given to RN and Post -op Vital signs reviewed and stable  Post vital signs: Reviewed and stable  Last Vitals:  Filed Vitals:   12/19/14 1440  BP: 113/70  Pulse: 115  Temp:   Resp:     Complications: No apparent anesthesia complications

## 2014-12-19 NOTE — Progress Notes (Signed)
Rachel Berry is a 34 y.o. G2P0010 at [redacted]w[redacted]d by ultrasound admitted for induction of labor. Pt was completely dialted at 8am today but very comfortable with epidural ans with no urge to push. Fetal station at 0 with cat 1 strip. Attempted pushing for with ne descent so pt allowed to labor down for 1-2hrs. Pt then pushed for an hour with minimal descent. She was again allowed to rest and has been pushing again now for past hour. She is appreciating more pressure with contractions and fetal station now at +2. Strip remains cat 1   Subjective:   Objective: BP 111/71 mmHg  Pulse 100  Temp(Src) 98.1 F (36.7 C) (Axillary)  Resp 18  Ht  (1.651 m)  Wt 65.772 kg (145 lb)  BMI 24.13 kg/m2   Total I/O In: -  Out: 200 [Urine:200]  FHT:  FHR: 155 bpm, variability: moderate,  accelerations:  Present,  decelerations:  Absent UC:   regular, every 1-2 minutes SVE:   Dilation: 10 Effacement (%): 100 Station: +2 Exam by:: [employer]: Lab Results  Component Value Date   WBC 19.0* 12/18/2014   HGB 13.0 12/18/2014   HCT 37.0 12/18/2014   MCV 87.7 12/18/2014   PLT 277 12/18/2014    Assessment / Plan: Induction of labor due to elective,  progressing well on pitocin  Labor: Progressing normally; pushing well Preeclampsia:  none Fetal Wellbeing:  Category I Pain Control:  Epidural I/D:  n/a Anticipated MOD:  NSVD  Rachel Berry 12/19/2014, 1:15 PM

## 2014-12-19 NOTE — Anesthesia Postprocedure Evaluation (Signed)
  Anesthesia Post-op Note  Patient: Rachel Berry  Procedure(s) Performed: Procedure(s) (LRB): CESAREAN SECTION (N/A)  Patient Location: PACU  Anesthesia Type: Spinal  Level of Consciousness: awake and alert   Airway and Oxygen Therapy: Patient Spontanous Breathing  Post-op Pain: mild  Post-op Assessment: Post-op Vital signs reviewed, Patient's Cardiovascular Status Stable, Respiratory Function Stable, Patent Airway and No signs of Nausea or vomiting  Last Vitals:  Filed Vitals:   12/19/14 2045  BP: 105/63  Pulse: 70  Temp: 36.6 C  Resp: 20    Post-op Vital Signs: stable   Complications: No apparent anesthesia complications

## 2014-12-19 NOTE — Progress Notes (Signed)
Rachel Berry is a 34 y.o. G2P0010 at [redacted]w[redacted]d by ultrasound admitted for induction of labor due to Elective at term. Pt progressed on of pitocin from 3cm and spontaneously ruptured early this am- clear fluid. She is appreciating pressure with contractions. Still anxious but excited  Subjective:   Objective: BP 101/62 mmHg  Pulse 85  Temp(Src) 98.3 F (36.8 C) (Oral)  Resp 18  Ht  (1.651 m)  Wt 65.772 kg (145 lb)  BMI 24.13 kg/m2      FHT:  FHR: 155 bpm, variability: moderate,  accelerations:  Present,  decelerations:  Absent UC:   regular, every 1-2 minutes SVE:   Dilation: 10 Effacement (%): 100 Station: 0 Exam by:: Paeton Studer  Labs: Lab Results  Component Value Date   WBC 19.0* 12/18/2014   HGB 13.0 12/18/2014   HCT 37.0 12/18/2014   MCV 87.7 12/18/2014   PLT 277 12/18/2014    Assessment / Plan: Induction of labor due to elective,  progressing well on pitocin. Will allow pt to labor down for next 30-76mins then start pushing  Labor: Progressing normally Preeclampsia:  none Fetal Wellbeing:  Category I Pain Control:  Epidural I/D:  n/a Anticipated MOD:  NSVD  Evert Wenrich Worema Surah Pelley 12/19/2014, 8:00 AM

## 2014-12-19 NOTE — Op Note (Signed)
Procedure note  Patient was taken to the operating room where epidural anesthesia was found to be adequate by Allis clamp test. She was prepped and draped in the normal sterile fashion in the dorsal supine position with a leftward tilt. An appropriate time out was performed. A Pfannenstiel skin incision was then made with the scalpel and carried through to the underlying layer of fascia by sharp dissection. The fascia was nicked in the midline and the incision was extended laterally with Metzembaum scissors. The inferior aspect of the incision was grasped Coker clamps and dissected off the underlying rectus muscles. In a similar fashion the superior aspect was dissected off the rectus muscles. Rectus muscles were separated in the midline and the peritoneal cavity entered bluntly. The peritoneal incision was then extended both superiorly and inferiorly with careful attention to avoid both bowel and bladder. A bladder blade was placed for bladder protection. The bladder flap was developed with Metzenbaum scissors and pushed away from the lower uterine segment. The bladder blader was replaced. The lower uterine segment was then incised in a transverse fashion and the cavity itself entered bluntly. The incision was extended bluntly. The infant's head was then lifted and delivered from the incision without difficulty. The remainder of the infant delivered and the nose and mouth bulb suctioned with the cord clamped and cut as well. The infant was handed off to the waiting pediatricians with vigorous spontaneous cry. Cord blood was obtained.  The placenta was then spontaneously expressed from the uterus and the uterus cleared of all clots and debris with moist lap sponge. The uterine incision was then repaired in 1 layer in a running locked fashion using 0- chromic suture. The tubes and ovaries were inspected and the gutters cleared of all clots and debris. The uterine incision was inspected and found to be hemostatic.  All instruments and sponges  were then removed from the abdomen. The rectus muscles and peritoneum were then reapproximated in a pursestring fashion for peritoneum adn a large loose figure 8 for rectus. The fascia was then closed with 0 Vicryl in a running fashion. Subcutaneous tissue was reapproximated with 3-0 vicryl in a running fashion. The skin was closed with a subcuticular stitch of 4-0 Vicryl on a Keith needle with great cosmesis and hemostasis and then reinforced with a honeycomb dressing. At the conclusion of the procedure all instruments and sponge counts were correct. Patient was taken to the recovery room in good condition with her baby accompanying her skin to skin.

## 2014-12-19 NOTE — Progress Notes (Signed)
RN IN ROOM AND INFANT PLACED TO BREAST TO NURSE. NURSING VERY PAINFUL. INFANT REPOSITION AND READJUSTED TO BREAST NIPPLE AND AREOLA. PT STILL EXTREMELY TENDER TO FEEDING. #20 NIPPLE SHIELD APPLIED TO LEFT NIPPLE AREA AND INFANT  TO NIPPLE SHIELD . PT STATED PAIN IMPROVED. SMALL AMOUNT OF COLOSTRUM  NOTED IN SHIELD.PT AND HUSBAND INSTRUCTED IN USE OF SHIELD AND CARE IN CLEANING SHIELD WITH MILD DETERGENT AND WATER/

## 2014-12-19 NOTE — Progress Notes (Signed)
Patient ID: Rachel Berry, female   DOB: [birth year], 34 y.o.   MRN: [id number] Pt received epidural and finally relaxed to allow SVE. Noted to only be 3cm dil. Explained to pt and husband that since there has been no cervical change since was checked in office at 5pm yesterday so she is essentially now being induced as she is not in active labor Pt is contracting q 1-49mins so will recheck in 1-2hours and if indicated will do oral cytotec for iol.  They verbalize understanding  CWBANGA

## 2014-12-19 NOTE — Progress Notes (Signed)
Anne-Fleur Nonaka is a 34 y.o. G2P0010 at [redacted]w[redacted]d by ultrasound admitted for induction of labor due to Elective at term.  Subjective: Pt has pushed >3hrs excluding rest periods with minimal descent. Caput noted. Discussed why not a good idea for vacuum. Fetal assesment reassuring. Decision to proceed to c/s.   Objective: BP 112/62 mmHg  Pulse 77  Temp(Src) 98.1 F (36.7 C) (Axillary)  Resp 18  Ht  (1.651 m)  Wt 65.772 kg (145 lb)  BMI 24.13 kg/m2   Total I/O In: -  Out: 600 [Urine:600]  FHT:  FHR: 155 bpm, variability: moderate,  accelerations:  Present,  decelerations:  Absent UC:   regular, every 2 minutes SVE:   Dilation: 10 Effacement (%): 100 Station: +2 Exam by:: [employer]: Lab Results  Component Value Date   WBC 19.0* 12/18/2014   HGB 13.0 12/18/2014   HCT 37.0 12/18/2014   MCV 87.7 12/18/2014   PLT 277 12/18/2014    Assessment / Plan: Arrest of decent; pt and husband counselled delivery via c/s recommended. R/B reviewed. All questions answered. Consent signed. Staff notified  Labor: arrest of descent Preeclampsia:  none Fetal Wellbeing:  Category I Pain Control:  Epidural I/D:  n/a Anticipated MOD:  primary c/s  Sharol Given Nealie Mchatton 12/19/2014, 2:27 PM

## 2014-12-20 LAB — CBC
HEMATOCRIT: 27.4 % — AB (ref 36.0–46.0)
HEMOGLOBIN: 9.3 g/dL — AB (ref 12.0–15.0)
MCH: 30.2 pg (ref 26.0–34.0)
MCHC: 33.9 g/dL (ref 30.0–36.0)
MCV: 89 fL (ref 78.0–100.0)
PLATELETS: 196 10*3/uL (ref 150–400)
RBC: 3.08 MIL/uL — ABNORMAL LOW (ref 3.87–5.11)
RDW: 13.5 % (ref 11.5–15.5)
WBC: 24.4 10*3/uL — ABNORMAL HIGH (ref 4.0–10.5)

## 2014-12-20 NOTE — Lactation Note (Signed)
This note was copied from the chart of Rachel Anne-Fleur Paulding. Lactation Consultation Note Follow up visit at 32 hours of age.  Baby has had 10 feedings with 2 voids 4 stools and last void >18 hours ago.  Mom has been using NS and reports pain with latch.  Baby in crib crying swaddled in blankets,  Mom unsure if she should feed baby again and is reluctant due to pain.  Worked with mom on hand expression and encouraged pre and post feeding to rub into sore nipples.  Nipples are normal and everted with redness to tips.  Mom demonstrated how Rn taught her to apply nipple shield but states she prefers to do is the easy way and place over her nipple.  Explained to mom importance of proper application of NS, mom is able to return demonstration.  After discussing proper deep latch mom first allows baby to suck up onto nipple with shaft exposed and reports pain.  Showed mom proper way to unlatch baby.  LC assisted with wide open mouth good deep latch with flanged lips and strong sucking bursts, few swallows audible.   Mom initially cried in pain and then reported it didn't hurt at all.  Discussed need to pump and reported to [facility] RN to set up during the night with DEBP.  Mom to call for assist as needed.    Patient Name: Rachel Berry Today's Date: 12/20/2014 Reason for consult: Follow-up assessment;Breast/nipple pain;Difficult latch   Maternal Data    Feeding Feeding Type: Breast Fed Length of feed:  (10 minutes)  LATCH Score/Interventions Latch: Repeated attempts needed to sustain latch, nipple held in mouth throughout feeding, stimulation needed to elicit sucking reflex. Intervention(s): Adjust position;Assist with latch;Breast massage;Breast compression  Audible Swallowing: A few with stimulation Intervention(s): Skin to skin;Hand expression;Alternate breast massage  Type of Nipple: Everted at rest and after stimulation  Comfort (Breast/Nipple): Filling, red/small blisters or bruises,  mild/mod discomfort  Problem noted: Mild/Moderate discomfort  Hold (Positioning): Assistance needed to correctly position infant at breast and maintain latch. Intervention(s): Breastfeeding basics reviewed;Support Pillows;Position options;Skin to skin  LATCH Score: 6  Lactation Tools Discussed/Used Tools: Nipple Shields Nipple shield size: 20   Consult Status Consult Status: Follow-up Date: 12/21/14 Follow-up type: In-patient    Jacalynn Buzzell, Arvella Merles 12/20/2014, 11:20 PM

## 2014-12-20 NOTE — Addendum Note (Signed)
Addendum  created 12/20/14 0981 by Algis Greenhouse, CRNA   Modules edited: Notes Section   Notes Section:  File: [id number]

## 2014-12-20 NOTE — Progress Notes (Signed)
Subjective: Postpartum Day 1: Cesarean Delivery Patient reports tolerating PO and no problems voiding.  Has not ambulated yet. Denies nausea/vomiting  Objective: Vital signs in last 24 hours: Temp:  [97.5 F (36.4 C)-99.2 F (37.3 C)] 97.8 F (36.6 C) (08/07 0805) Pulse Rate:  [60-115] 60 (08/07 0805) Resp:  [13-22] 18 (08/07 0805) BP: (82-126)/(43-81) 84/53 mmHg (08/07 0805) SpO2:  [96 %-100 %] 100 % (08/07 0805)  Physical Exam:  General: alert, cooperative and no distress Lochia: appropriate Uterine Fundus: firm Incision: no significant drainage DVT Evaluation: scds still in place; no edema   Recent Labs  12/18/14 2225 12/20/14 0525  HGB 13.0 9.3*  HCT 37.0 27.4*    Assessment/Plan: Status post Cesarean section. Doing well postoperatively.  Encourage ambulation today. Pain well controlled Lactation consultant Sharol Given Banga 12/20/2014, 8:59 AM

## 2014-12-20 NOTE — Anesthesia Postprocedure Evaluation (Signed)
Anesthesia Post Note  Patient: Rachel Berry  Procedure(s) Performed: Procedure(s) (LRB): CESAREAN SECTION (N/A)  Anesthesia type: Epidural  Patient location: Mother/Baby  Post pain: Pain level controlled  Post assessment: Post-op Vital signs reviewed  Last Vitals:  Filed Vitals:   12/20/14 0400  BP: 82/46  Pulse: 65  Temp: 36.7 C  Resp: 18    Post vital signs: Reviewed  Level of consciousness:alert  Complications: No apparent anesthesia complications

## 2014-12-20 NOTE — Lactation Note (Signed)
This note was copied from the chart of Rachel Berry. Lactation Consultation Note  Patient Name: Rachel Anne-Fleur Grills Today's Date: 12/20/2014 Reason for consult: Initial assessment Baby 22 hours old. Mom eating lunch when this LC entered room. Mom states that she was fitted with a NS but is still experiencing nipple pain when nursing. Enc mom to call out at next feeding for assistance. Mom given [facility] brochure, aware of OP/BFSG, community resources, and [facility] phone line assistance after D/C.   Maternal Data    Feeding Feeding Type: Breast Fed Length of feed: 25 min  LATCH Score/Interventions                      Lactation Tools Discussed/Used     Consult Status Consult Status: Follow-up Date: 12/21/14 Follow-up type: In-patient    Geralynn Ochs 12/20/2014, 1:45 PM

## 2014-12-21 ENCOUNTER — Encounter (HOSPITAL_COMMUNITY): Payer: Self-pay | Admitting: Obstetrics and Gynecology

## 2014-12-21 NOTE — Progress Notes (Signed)
Subjective: Postpartum Day 2: Cesarean Delivery Patient reports incisional pain, tolerating PO and + flatus.   Pain controlled with meds. Ambulating well. Mild discomfrot with voids. No BM  Objective: Vital signs in last 24 hours: Temp:  [97.4 F (36.3 C)-98 F (36.7 C)] 97.4 F (36.3 C) (08/08 0649) Pulse Rate:  [60-67] 67 (08/08 0649) Resp:  [18] 18 (08/08 0649) BP: (84-102)/(53-60) 89/54 mmHg (08/08 0649) SpO2:  [100 %] 100 % (08/07 0805)  Physical Exam:  General: alert, cooperative and no distress Lochia: appropriate Uterine Fundus: firm Incision: healing well, no significant drainage DVT Evaluation: Negative Homan's sign.   Recent Labs  12/18/14 2225 12/20/14 0525  HGB 13.0 9.3*  HCT 37.0 27.4*    Assessment/Plan: Status post Cesarean section. Doing well postoperatively.  Anticipate discharge home tomorrow.  Rachel Berry 12/21/2014, 7:08 AM

## 2014-12-21 NOTE — Lactation Note (Signed)
This note was copied from the chart of Rachel Berry. Lactation Consultation Note  Patient Name: Rachel Anne-Fleur Mcbryar Today's Date: 12/21/2014 Reason for consult: Follow-up assessment;Breast/nipple pain   Follow-up at 50 hours old; difficult latch; painful nipples, finger feeding;  P1 mom. Over the past 24 hours Infant has breastfed x5 (20-30 min) yesterday + formula Alimentum syringe finger fed x7 (15 ml) all day today; voids-1 in 24 hours/ 3 life; stools-5 in 24 hours/ 8 life. RN reported mom has not latched baby to breast at all today during the day nor has she pumped today.  RN was working with mom on pumping when [facility] entered room. Reviewed pumping on preemie setting; mom can only tolerate 2 teardrops.  Decreased flange size on right to #21 and increased flange size on left to #27.   Taught mom how to pump using hands-on pumping but mom stated breast are very tender and could not tolerate LC showing her how to massage while pumping.   Dad at bedside assisting with all feeding care and assisting with pumping by helping hold flanges.  No milk was expressed with current pumping session.   LC assessed infant's mouth as infant began cuing.  Noted upper lip frenulum extends to tip of gum line and is wide at base not allowing upper lip to flange to nostrils; suck blisters noted on upper lip.  LC could feel thick posterior submucosal tissue with a finger sweep and noted a short thick posterior frenulum with manual lifting of tongue (minimal visibility).  Infant can stick tongue out past gum line with sharp tip of tongue noted.  Minimal lateralization with tip of tongue but also rolls tongue to follow gloved finger.  When sucking on gloved finger, infant loses contact with gloved finger.   Explained effects of tongue restrictions with breastfeeding and encouraged parents to seek expert assessment if pain with breastfeeding continues.   Dad finger fed with curved-tip syringe 11 ml formula.  LC  gave foley cup and taught dad how to cup feed.  Infant fed an additional 6 ml by cup.  Infant did not want to open mouth wide.   Explained to parents that cup feeding will help infant open mouth and stick out tongue.   Encouraged tummy time when infant is awake and explained procedure for tummy time to help stretch head/ neck muscles that can also cause tongue restrictions.  When asked what their plan is for breastfeeding and latching, mom seems very hesitant to latch d/t pain and was asking more questions about pumping to give EBM.   Mom may decide to pump and bottle feed infant.  Explained the need to pump &/or breastfeed at least 8 times per day to build and maintain milk supply. Mom has a DEBP at home she received from insurance company but was unsure of brand.  Parents were going to check on brand prior to discharge.  Encouraged parents to take all pump parts when they leave hospital and showed all the parts that need to be removed from pump on discharge. Mom is wearing comfort gels given by RN.      Maternal Data    Feeding Feeding Type: Formula  LATCH Score/Interventions                      Lactation Tools Discussed/Used     Consult Status Consult Status: Follow-up Date: 12/22/14 Follow-up type: In-patient    Lendon Ka 12/21/2014, 6:06 PM

## 2014-12-22 MED ORDER — SENNOSIDES-DOCUSATE SODIUM 8.6-50 MG PO TABS: 2.0000 | ORAL_TABLET | ORAL | Status: DC

## 2014-12-22 MED ORDER — IBUPROFEN 600 MG PO TABS: 600.0000 mg | ORAL_TABLET | Freq: Four times a day (QID) | ORAL | Status: DC

## 2014-12-22 MED ORDER — OXYCODONE-ACETAMINOPHEN 5-325 MG PO TABS: 1.0000 | ORAL_TABLET | ORAL | Status: DC | PRN

## 2014-12-22 NOTE — Lactation Note (Signed)
This note was copied from the chart of Rachel Anne-Fleur Warne. Lactation Consultation Note  Patient Name: Rachel Berry Today's Date: 12/22/2014 Reason for consult: Follow-up assessment;Other (Comment) (see LC note )  Baby is 35 hours old , has been fed by finger feeding, cup and most recently with a bottle  Due to sore nipples. Per mom has pumped 4 x's since [facility] consult late yesterday afternoon.  Without EBM yield. LC assessed breast tissue with moms permission - breast are fuller , heavier , and  Warm. Nipples pink with old positional strips noted. Mom mentioned she has a desire to breast feed once  Her nipples are less sore. LC resized Nipple shield's on both right and left - left the size was increased to #24  Accommodated the base of the areola better. On the right the #24 was to big , and the #20 was to snug at the base.  Mom also mentioned she was still having a lot of discomfort ( generalized form the C/s ). LC recommended for today For mom to go home , work on making herself more comfortable especially her belly gas and pain , keep up her consistent  Pumping with her personal Ameda DEBP to establish and protect her milk supply. When she has got'en much needed rest ,  Pain better control , ( maybe tonight) , to latch with the Nipple shield #24 on the left breast , also instill EBM into the NS after it is applied. For an appetizer. Feed for 15 -20 mins , and supplement after with a broad based Medela nipple. Post pump 10-15 mins. Save milk . For sore nipples - comfort gels after feedings , and follow with breast shells, and be consistent with pumping .  Sore nipple and engorgement prevention tx reviewed. LC instructed mom on the use of shells .  LC reassessed flange size while mom was pumping , decreased to #24 for the left , ans stayed at #21 for the right. Mother informed of post-discharge support and given phone number to the lactation department, including services for phone  call assistance;  out-patient appointments; and breastfeeding support group. List of other breastfeeding resources in the community given in the handout. Encouraged  mother to call for problems or concerns related to breastfeeding.  F/U apt Friday 8/12 at 1030 at [facility], apt reminder given to mom.    Maternal Data Has patient been taught Hand Expression?: Yes Does the patient have breastfeeding experience prior to this delivery?: No  Feeding Feeding Type: Bottle Fed - Formula Nipple Type: Slow - flow  LATCH Score/Interventions                Intervention(s): Breastfeeding basics reviewed     Lactation Tools Discussed/Used Tools: Shells;Pump;Comfort gels;Nipple Dorris Carnes;Flanges Nipple shield size: 24;20 (resized ) Flange Size: Other (comment) (see LC note for resizing ) Shell Type: Inverted Breast pump type: Double-Electric Breast Pump WIC Program: No Pump Review: Milk Storage   Consult Status Consult Status: Follow-up Date: 12/25/14 Follow-up type: Out-patient (@ 1030 am [facility] office )    Rachel Berry 12/22/2014, 12:03 PM

## 2014-12-22 NOTE — Discharge Summary (Signed)
Obstetric Discharge Summary Reason for Admission: induction of labor Prenatal Procedures: none Intrapartum Procedures: cesarean: low cervical, transverse Postpartum Procedures: none Complications-Operative and Postpartum: none HEMOGLOBIN  Date Value Ref Range Status  12/20/2014 9.3* 12.0 - 15.0 g/dL Final    Comment:    DELTA CHECK NOTED REPEATED TO VERIFY    HCT  Date Value Ref Range Status  12/20/2014 27.4* 36.0 - 46.0 % Final    Physical Exam:  General: alert, cooperative and no distress Lochia: appropriate Uterine Fundus: firm Incision: healing well, no dehiscence   Discharge Diagnoses: Term Pregnancy-delivered  Discharge Information: Date: 12/22/2014 Activity: pelvic rest Diet: routine Medications: PNV, Ibuprofen, Colace and Percocet Condition: stable Instructions: refer to practice specific booklet Discharge to: home Follow-up Information    Follow up with Sharol Given Banga, DO In 2 weeks.   Specialty:  Obstetrics and Gynecology   Why:  for incision check and 6weeks for postpartum visit   Contact information:   [address] [place] [address] [phone]       Newborn Data: Live born female  Birth Weight: 8 lb 14.5 oz (4040 g) APGAR: 8, 9  Home with mother.  Mare Loan Worema Banga 12/22/2014, 8:53 AM

## 2014-12-22 NOTE — Discharge Instructions (Signed)
Avoid driving for at least 1-2 weeks or until off narcotic pain meds.  No heavy lifting greater than 10 lbs.  Nothing in vagina for 6 weeks.  May remove bandage in 3-4 days.  Shower over incision and pat dry. ° °

## 2014-12-22 NOTE — Progress Notes (Signed)
Subjective: Postpartum Day 3: Cesarean Delivery Patient reports tolerating PO, + BM and no problems voiding.    Objective: Vital signs in last 24 hours: Temp:  [98 F (36.7 C)-98.2 F (36.8 C)] 98 F (36.7 C) (08/09 0636) Pulse Rate:  [68-71] 68 (08/09 0636) Resp:  [18-19] 19 (08/09 0636) BP: (103-106)/(62-75) 106/75 mmHg (08/09 0636) SpO2:  [100 %] 100 % (08/09 0636)  Physical Exam:  General: alert, cooperative and no distress Lochia: appropriate Uterine Fundus: firm Incision: healing well, no significant drainage    Recent Labs  12/20/14 0525  HGB 9.3*  HCT 27.4*    Assessment/Plan: Status post Cesarean section. Doing well postoperatively.  Discharge home with standard precautions and return to clinic in 2-6 weeks.  Hillary Struss Worema Tuwanda Vokes 12/22/2014, 9:06 AM

## 2014-12-22 NOTE — Discharge Summary (Signed)
OB Discharge Summary  Patient Name: Anne-Fleur Riga DOB: 1980/06/16 MRN: [id number]  Date of admission: 12/18/2014     Date of discharge: No discharge date for patient encounter.  Admitting diagnosis: Induction of labor    Intrauterine pregnancy: [redacted]w[redacted]d     Secondary diagnosis: None     Discharge diagnosis: Term Pregnancy Delivered  Method of delivery:      Information for the patient's newborn:  Quesenberry, Girl Anne-Fleur [[id number]]  Delivery Method: CS-LTranv                                                                                                     Intrapartum Procedures:                                                          Post partum procedures:none      Hospital course:  Induction of Labor With Cesarean Section  34 y.o. yo G2P1011 at [redacted]w[redacted]d was admitted to the hospital 12/18/2014 for induction of labor. Patient had a labor course significant for prolonged second phase. The patient went for cesarean section due to Arrest of Descent, and delivered a Viable infant,12/19/2014 Details of operation can be found in separate operative Note.  Patient had an uncomplicated postpartum course. She is ambulating, tolerating a regular diet, passing flatus, and urinating well.  Patient is discharged home in stable condition on No discharge date for patient encounter.Marland Kitchen                                     Physical exam  Filed Vitals:   12/20/14 1627 12/21/14 0649 12/21/14 1746 12/22/14 0636  BP: 102/60 89/54 103/62 106/75  Pulse: 62 67 71 68  Temp: 98 F (36.7 C) 97.4 F (36.3 C) 98.2 F (36.8 C) 98 F (36.7 C)  TempSrc: Oral Oral Oral Oral  Resp: Height:      Weight:      SpO2:    100%   General: alert, cooperative and no distress Lochia: appropriate Uterine Fundus: firm Incision: Healing well with no significant drainage  Labs: Lab Results  Component Value Date   WBC 24.4* 12/20/2014   HGB 9.3* 12/20/2014   HCT 27.4* 12/20/2014   MCV 89.0  12/20/2014   PLT 196 12/20/2014   No flowsheet data found.  Discharge instruction: per After Visit Summary and "Baby and Me Booklet".  Medications:   Medication List    ASK your doctor about these medications        ibuprofen 200 MG tablet  Commonly known as:  MOTRIN IB  Take 3 tablets (600 mg total) by mouth every 6 (six) hours as needed.     multivitamin-prenatal 27-0.8 MG Tabs tablet  Take 1 tablet by mouth daily at 12 noon.  Diet: routine diet  Activity: Advance as tolerated. Pelvic rest for 6 weeks.   Outpatient follow up:2 weeks and 6weeks  Postpartum contraception: Undecided  Newborn Data: Live born female  Birth Weight: 8 lb 14.5 oz (4040 g) APGAR: 8, 9  Baby Feeding: Bottle and Breast Disposition:home with mother   12/22/2014 Sharol Given Banga, DO

## 2014-12-25 ENCOUNTER — Ambulatory Visit (HOSPITAL_COMMUNITY)
Admit: 2014-12-25 | Discharge: 2014-12-25 | Disposition: A | Discharge status: Home / Self Care | Payer: 59 | Attending: Obstetrics and Gynecology | Admitting: Obstetrics and Gynecology

## 2014-12-25 NOTE — Lactation Note (Signed)
Lactation Consult  Mother's reason for visit:  Help with breast feeding Visit Type:  Feeding assessment Appointment Notes:  Very sore nipples, using NS Consult:  Initial Lactation Consultant:  Audry Riles D  ________________________________________________________________________    ________________________________________________________________________  Mother's Name: Rachel Berry Type of delivery:   Breastfeeding Experience:  P1  Maternal Medications:  Percocet, Motrin  ________________________________________________________________________  Breastfeeding History (Post Discharge)  Frequency of breastfeeding:  Once today. Has been mostly bottle feeding formula and small amounts of EBM Duration of feeding:  15 min- baby gets fussy and they give forula  Supplementation  Formula:  Volume 60 ml Frequency:  q feeding       Brand: Similac  Breastmilk:  Volume 20-40 ml Frequency:  Once or twice a day  Method:  Bottle,   Pumping  Type of pump:  Ameda Frequency:  1-2 times/day Volume:  20-40 ml  Infant Intake and Output Assessment  Voids:  6-8 in 24 hrs.  Color:  Clear yellow  Had void and stool while here for appointment Stools:  6-8 in 24 hrs.  Color:  Brown and Yellow  ________________________________________________________________________  Maternal Breast Assessment  Breast:  Soft Nipple:  Erect- slightly pink but intact, more reports thay are much better  _______________________________________________________________________ Feeding Assessment/Evaluation  Initial feeding assessment:  I Positioning:  Football Right breast  LATCH documentation:  Latch:  0 = Too sleepy or reluctant, no latch achieved, no sucking elicited.  Audible swallowing:  0 = None  Type of nipple:  2 = Everted at rest and after stimulation  Comfort (Breast/Nipple):  2 = Soft / non-tender  Hold (Positioning):  1 = Assistance needed to correctly position infant at breast and  maintain latch  LATCH score:  5    Tools:  Nipple shield 24 mm Instructed on use and cleaning of tool:  Yes.    Pre-feed weight:  3986  g  (8 lb. 12.6  oz.)   Mom here today for assist with breast feeding. Reports her nipples were very sore in hospital and too sore to latch baby. She has only put the baby to the breast a few times in the last few days, Did latch baby this morning and she nursed for about 10 min. Parents fed baby 60 ml of formula plus 30 ml of EBM about 1 1/2 hours before coming to appointment, Baby took a few sucks then off to sleep. Attempted to latch baby without NS but after 2 sucks mom reports too much pain and wants to use the NS. Reviewed correct placement of NS. Only pumped once yesterday. Reviewed importance of frequent nursing or pumping to increase milk supply- at least 8 times/day. Did not reweigh baby due to baby did not nurse. Mom teary and states she feels guilty that baby is not getting more breast milk. Reassurance given that baby is getting some breast milk. Suggested putting some formula or EBM into NS with curved tip syringe so baby gets something as soon as she gets to the breast.Mom asking about making another appointment. Appointment made for Friday 8/19 at 10:30 am. No further questions at present. To call rpn

## 2015-01-01 ENCOUNTER — Ambulatory Visit (HOSPITAL_COMMUNITY)
Admission: RE | Admit: 2015-01-01 | Discharge: 2015-01-01 | Disposition: A | Discharge status: Home / Self Care | Payer: 59 | Source: Ambulatory Visit | Attending: Obstetrics and Gynecology | Admitting: Obstetrics and Gynecology

## 2015-01-01 NOTE — Lactation Note (Signed)
Lactation Consult  Mother's reason for visit:  Follow up visit from last week Visit Type:  Feeding assessment Appointment Notes:  Using NS Consult:  Follow-Up Lactation Consultant:  Pamelia Hoit  ________________________________________________________________________ [name] Name: Rachel Berry Date of Birth: 12/19/2012 Pediatrician: Chestine Spore Gender: female Gestational Age: [redacted]w[redacted]d (At Birth) Birth Weight: 8 lb 14.5 oz (4040 g) Weight at Discharge: Weight: 8 lb 5.3 oz (3780 g)Date of Discharge: 12/22/2014 Filed Weights   12/19/14 2333 12/21/14 0115 12/22/14 0000  Weight: 8 lb 13.6 oz (4015 g) 8 lb 5.9 oz (3795 g) 8 lb 5.3 oz (3780 g)       Weight today  8 lbs 15.1 oz 4058 g   Up 2.5 oz in 1 week, Was 8- 12.6 here last week ________________________________________________________________________  Mother's Name: Rachel Berry Type of delivery:  C/S Breastfeeding Experience:  P1 ________________________________________________________________________  Breastfeeding History (Post Discharge)  Frequency of breastfeeding:  q 1/12- 3 Duration of feeding:  30-45  Supplementation  Formula:  Volume 30-60 ml Frequency: 6 times/day        Brand: Similac  Breastmilk:  Volume 30-40 ml Frequency:  4-5 times/day   Method:  Bottle,   Pumping  Type of pump:  Medela pump in style Frequency:  4-5 times.day Volume:  30-45 ml  Infant Intake and Output Assessment  Voids: 6-8 in 24 hrs.  Color:  Clear yellow  Had large void and stool while here for appointment Stools:  6-8 in 24 hrs.  Color:  Brown and Yellow  ________________________________________________________________________  Maternal Breast Assessment  Breast:  Soft Nipple:  Erect  _______________________________________________________________________ Feeding Assessment/Evaluation    Mom latched baby to left breast with NS easily by herself. Baby nursed for 15 min with some swallows  noted. Mom does not want to try to latch without NS. Initial feeding assessment:   Pre-feed weight:  4058 g  (8 lb. 15.1oz.) Post-feed weight:  4078 g (8  lb. 15.9 oz.) Amount transferred:  20 ml Amount supplemented:  0 ml  Nursed on other breast after diaper change  Beginning weight 8- 15.3  4062 g After feeding for 20 min, baby needed some stimulation to continue nursing 9 0.1  4084 g   Up 22 g  Large void, still fussy nursed again 8- 13.8  4020 after nursing 8- 15  4053  Up 33 g Content after nursing.   Mom reports sometimes baby is very fussy and will not latch. Suggested placing some EBM or formula in NS or feeding a little then trying to latch baby. Reviewed weight gain  Of 1 oz/day and suggested giving a little more EBM or  formula after nursing, baby may be ready to take 60 cc's/ feeding. Dad feeds her a night when mom is sleeping and 30 cc's is not enough- still fussy.  To see Ped on Monday, will watch weight gain.     Total amount transferred:  75 ml Total supplement given:  0 ml

## 2015-04-14 ENCOUNTER — Ambulatory Visit: Payer: 59 | Admitting: Podiatry

## 2017-05-21 LAB — OB RESULTS CONSOLE GC/CHLAMYDIA
CHLAMYDIA, DNA PROBE: NEGATIVE
Gonorrhea: NEGATIVE

## 2017-05-21 LAB — OB RESULTS CONSOLE RPR: RPR: NONREACTIVE

## 2017-05-21 LAB — OB RESULTS CONSOLE ABO/RH: RH Type: POSITIVE

## 2017-05-21 LAB — OB RESULTS CONSOLE RUBELLA ANTIBODY, IGM: RUBELLA: IMMUNE

## 2017-05-21 LAB — OB RESULTS CONSOLE HEPATITIS B SURFACE ANTIGEN: HEP B S AG: NEGATIVE

## 2017-05-21 LAB — OB RESULTS CONSOLE HIV ANTIBODY (ROUTINE TESTING): HIV: NONREACTIVE

## 2017-05-21 LAB — OB RESULTS CONSOLE ANTIBODY SCREEN: ANTIBODY SCREEN: NEGATIVE

## 2017-11-12 LAB — OB RESULTS CONSOLE GBS: GBS: NEGATIVE

## 2017-11-21 ENCOUNTER — Telehealth (HOSPITAL_COMMUNITY): Payer: Self-pay | Admitting: *Deleted

## 2017-11-21 ENCOUNTER — Encounter (HOSPITAL_COMMUNITY): Payer: Self-pay | Admitting: *Deleted

## 2017-11-21 NOTE — Telephone Encounter (Signed)
Preadmission screen  

## 2017-11-22 ENCOUNTER — Telehealth (HOSPITAL_COMMUNITY): Payer: Self-pay | Admitting: *Deleted

## 2017-11-22 NOTE — Telephone Encounter (Signed)
Preadmission screen  

## 2017-11-23 ENCOUNTER — Telehealth (HOSPITAL_COMMUNITY): Payer: Self-pay | Admitting: *Deleted

## 2017-11-23 ENCOUNTER — Encounter (HOSPITAL_COMMUNITY): Payer: Self-pay

## 2017-11-23 NOTE — Telephone Encounter (Signed)
Preadmission screen  

## 2017-11-28 NOTE — H&P (Signed)
Rachel Berry is a 37 y.o. G19P1011 female presenting at 39+ weeks  for scheduled elective repeat cesarean section. Pt had her first cesarean for arrest of dilation. She was offered TOLAC but declined - r/b were reviewed. Her prenatal care has been benign; dated per LMP and confirmed per 8 week [place]. Measured s<d but [place] confirmed AGA. She was treated for hx of herpes with valtrex beginning at 36 weeks prophylactically. GBS is negative.  OB History    Gravida  3   Para  1   Term  1   Preterm  0   AB  1   Living  1     SAB  1   TAB  0   Ectopic  0   Multiple  0   Live Births  1          Past Medical History:  Diagnosis Date  . Complication of anesthesia    itching  . Medical history non-contributory   . Missed ab    Past Surgical History:  Procedure Laterality Date  . CESAREAN SECTION N/A 12/19/2014   Procedure: CESAREAN SECTION;  Surgeon: Edwinna Areola, DO;  Location: [facility];  Service: Obstetrics;  Laterality: N/A;  . DILATION AND CURETTAGE OF UTERUS N/A 10/30/2013   Procedure: DILATATION AND CURETTAGE;  Surgeon: Oliver Pila, MD;  Location: [facility];  Service: Gynecology;  Laterality: N/A;   Family History: family history is not on file. Social History:  reports that she has never smoked. She has never used smokeless tobacco. She reports that she does not drink alcohol or use drugs.     Maternal Diabetes: No Genetic Screening: Normal Maternal Ultrasounds/Referrals: Normal Fetal Ultrasounds or other Referrals:  None Maternal Substance Abuse:  No Significant Maternal Medications:  None Significant Maternal Lab Results:  Lab values include: Group B Strep negative Other Comments:  None  Review of Systems  Constitutional: Negative for chills, fever, malaise/fatigue and weight loss.  Eyes: Negative for blurred vision and double vision.  Respiratory: Negative for shortness of breath.   Cardiovascular: Negative for chest pain.   Gastrointestinal: Positive for abdominal pain. Negative for heartburn, nausea and vomiting.  Genitourinary: Negative for dysuria.  Musculoskeletal: Negative for myalgias and neck pain.  Skin: Negative for itching and rash.  Neurological: Negative for dizziness and headaches.  Endo/Heme/Allergies: Negative for environmental allergies. Does not bruise/bleed easily.  Psychiatric/Behavioral: Negative for depression, hallucinations, substance abuse and suicidal ideas. The patient is nervous/anxious.    Maternal Medical History:  Reason for admission: Nausea. Scheduled repeat cesarean section - elective  Contractions: Perceived severity is mild.    Fetal activity: Perceived fetal activity is normal.   Last perceived fetal movement was within the past hour.    Prenatal complications: no prenatal complications Prenatal Complications - Diabetes: none.      unknown if currently breastfeeding. Maternal Exam:  Uterine Assessment: Contraction frequency is rare.   Abdomen: Patient reports generalized tenderness.  Estimated fetal weight is AGA.   Fetal presentation: vertex  Introitus: Normal vulva. Vulva is negative for condylomata and lesion.    Physical Exam  Constitutional: She is oriented to person, place, and time. She appears well-developed and well-nourished.  HENT:  Head: Normocephalic.  Neck: Normal range of motion.  Cardiovascular: Normal rate.  Respiratory: Effort normal.  GI: Soft. There is generalized tenderness.  Genitourinary: Vagina normal and uterus normal. Vulva exhibits no lesion.  Musculoskeletal: Normal range of motion. She exhibits no edema.  Neurological: She  is alert and oriented to person, place, and time.  Skin: Skin is warm.  Psychiatric: She has a normal mood and affect. Her behavior is normal. Judgment and thought content normal.    Prenatal labs: ABO, Rh: A/Positive/-- (01/07 0000) Antibody: Negative (01/07 0000) Rubella: Immune (01/07 0000) RPR:  Nonreactive (01/07 0000)  HBsAg: Negative (01/07 0000)  HIV: Non-reactive (01/07 0000)  GBS: Negative (07/01 0000)   Assessment/Plan: 36yo G 3P1011 female here for scheduled repeat cesarean section - elective Declined TOLAC Risks/benefits reviewed and all questions answered To OR when ready Antibiotics preop  Janean SarkCecilia W Banga 11/28/2017, 11:33 PM

## 2017-11-30 ENCOUNTER — Encounter (HOSPITAL_COMMUNITY)
Admission: RE | Admit: 2017-11-30 | Discharge: 2017-11-30 | Disposition: A | Discharge status: Home / Self Care | Payer: 59 | Source: Ambulatory Visit | Attending: Obstetrics and Gynecology | Admitting: Obstetrics and Gynecology

## 2017-11-30 ENCOUNTER — Telehealth (HOSPITAL_COMMUNITY): Payer: Self-pay | Admitting: *Deleted

## 2017-11-30 HISTORY — DX: Other complications of anesthesia, initial encounter: T88.59XA

## 2017-11-30 HISTORY — DX: Other specified health status: Z78.9

## 2017-11-30 HISTORY — DX: Adverse effect of unspecified anesthetic, initial encounter: T41.45XA

## 2017-11-30 LAB — CBC
HCT: 31 % — ABNORMAL LOW (ref 36.0–46.0)
Hemoglobin: 10.3 g/dL — ABNORMAL LOW (ref 12.0–15.0)
MCH: 28.2 pg (ref 26.0–34.0)
MCHC: 33.2 g/dL (ref 30.0–36.0)
MCV: 84.9 fL (ref 78.0–100.0)
PLATELETS: 261 10*3/uL (ref 150–400)
RBC: 3.65 MIL/uL — AB (ref 3.87–5.11)
RDW: 13.4 % (ref 11.5–15.5)
WBC: 9.7 10*3/uL (ref 4.0–10.5)

## 2017-11-30 LAB — TYPE AND SCREEN
ABO/RH(D): A POS
Antibody Screen: NEGATIVE

## 2017-11-30 NOTE — Patient Instructions (Signed)
Rachel Berry  11/30/2017   Your procedure is scheduled on:  12/03/2017  Enter through the Main [facility]tr[facility] of [facility] at 0745 AM.  Pick up the phone at the desk and dial [phone]  Call this number if you have problems the morning of surgery:[phone]  Remember:   Do not eat food:(After Midnight) Desps de medianoche.  Do not drink clear liquids: (After Midnight) Desps de medianoche.  Take these medicines the morning of surgery with A SIP OF WATER: none   Do not wear jewelry, make-up or nail polish.  Do not wear lotions, powders, or perfumes. Do not wear deodorant.  Do not shave 48 hours prior to surgery.  Do not bring valuables to the hospital.  [facility] is not   responsible for any belongings or valuables brought to the hospital.  Contacts, dentures or bridgework may not be worn into surgery.  Leave suitcase in the car. After surgery it may be brought to your room.  For patients admitted to the hospital, checkout time is 11:00 AM the day of              discharge.    N/A   Please read over the following fact sheets that you were given:   Surgical Site Infection Prevention

## 2017-11-30 NOTE — Telephone Encounter (Signed)
Preadmission screen  

## 2017-12-01 LAB — RPR: RPR: NONREACTIVE

## 2017-12-03 ENCOUNTER — Inpatient Hospital Stay (HOSPITAL_COMMUNITY): Payer: 59 | Admitting: Anesthesiology

## 2017-12-03 ENCOUNTER — Encounter (HOSPITAL_COMMUNITY): Payer: Self-pay | Admitting: General Practice

## 2017-12-03 ENCOUNTER — Inpatient Hospital Stay (HOSPITAL_COMMUNITY)
Admission: RE | Admit: 2017-12-03 | Discharge: 2017-12-06 | DRG: 787 | Disposition: A | Discharge status: Home / Self Care | Payer: 59 | Attending: Obstetrics and Gynecology | Admitting: Obstetrics and Gynecology

## 2017-12-03 ENCOUNTER — Encounter (HOSPITAL_COMMUNITY)
Admission: RE | Disposition: A | Discharge status: Home / Self Care | Payer: Self-pay | Source: Home / Self Care | Attending: Obstetrics and Gynecology

## 2017-12-03 DIAGNOSIS — O34211 Maternal care for low transverse scar from previous cesarean delivery: Principal | ICD-10-CM | POA: Diagnosis present

## 2017-12-03 DIAGNOSIS — D649 Anemia, unspecified: Secondary | ICD-10-CM | POA: Diagnosis present

## 2017-12-03 DIAGNOSIS — O9902 Anemia complicating childbirth: Secondary | ICD-10-CM | POA: Diagnosis present

## 2017-12-03 DIAGNOSIS — O9832 Other infections with a predominantly sexual mode of transmission complicating childbirth: Secondary | ICD-10-CM | POA: Diagnosis present

## 2017-12-03 DIAGNOSIS — A6000 Herpesviral infection of urogenital system, unspecified: Secondary | ICD-10-CM | POA: Diagnosis present

## 2017-12-03 DIAGNOSIS — Z3490 Encounter for supervision of normal pregnancy, unspecified, unspecified trimester: Secondary | ICD-10-CM

## 2017-12-03 DIAGNOSIS — Z98891 History of uterine scar from previous surgery: Secondary | ICD-10-CM

## 2017-12-03 DIAGNOSIS — Z3A39 39 weeks gestation of pregnancy: Secondary | ICD-10-CM | POA: Diagnosis not present

## 2017-12-03 SURGERY — Surgical Case
Anesthesia: Spinal

## 2017-12-03 MED ORDER — MORPHINE SULFATE (PF) 0.5 MG/ML IJ SOLN
INTRAMUSCULAR | Status: DC | PRN
  Administered 2017-12-03: .2 mg via EPIDURAL

## 2017-12-03 MED ORDER — OXYCODONE HCL 5 MG PO TABS
10.0000 mg | ORAL_TABLET | ORAL | Status: DC | PRN
  Administered 2017-12-04 (×2): 10 mg via ORAL
  Administered 2017-12-04: 5 mg via ORAL
  Administered 2017-12-05 (×3): 10 mg via ORAL
  Filled 2017-12-03 (×6): qty 2

## 2017-12-03 MED ORDER — SODIUM CHLORIDE 0.9 % IJ SOLN
INTRAMUSCULAR | Status: AC
  Filled 2017-12-03: qty 20

## 2017-12-03 MED ORDER — DIPHENHYDRAMINE HCL 50 MG/ML IJ SOLN: 12.5000 mg | INTRAMUSCULAR | Status: DC | PRN

## 2017-12-03 MED ORDER — DIPHENHYDRAMINE HCL 25 MG PO CAPS: 25.0000 mg | ORAL_CAPSULE | Freq: Four times a day (QID) | ORAL | Status: DC | PRN

## 2017-12-03 MED ORDER — SIMETHICONE 80 MG PO CHEW
80.0000 mg | CHEWABLE_TABLET | ORAL | Status: DC
  Administered 2017-12-04 – 2017-12-05 (×3): 80 mg via ORAL
  Filled 2017-12-03 (×3): qty 1

## 2017-12-03 MED ORDER — PRENATAL MULTIVITAMIN CH
1.0000 | ORAL_TABLET | Freq: Every day | ORAL | Status: DC
  Administered 2017-12-04 – 2017-12-06 (×3): 1 via ORAL
  Filled 2017-12-03 (×3): qty 1

## 2017-12-03 MED ORDER — ONDANSETRON HCL 4 MG/2ML IJ SOLN
INTRAMUSCULAR | Status: AC
  Filled 2017-12-03: qty 2

## 2017-12-03 MED ORDER — OXYTOCIN 40 UNITS IN LACTATED RINGERS INFUSION - SIMPLE MED: 2.5000 [IU]/h | INTRAVENOUS | Status: AC

## 2017-12-03 MED ORDER — FENTANYL CITRATE (PF) 100 MCG/2ML IJ SOLN
INTRAMUSCULAR | Status: DC | PRN
  Administered 2017-12-03: 40 ug via INTRAVENOUS
  Administered 2017-12-03: 10 ug via INTRAVENOUS

## 2017-12-03 MED ORDER — LACTATED RINGERS IV SOLN
INTRAVENOUS | Status: DC | PRN
  Administered 2017-12-03: 10:00:00 via INTRAVENOUS

## 2017-12-03 MED ORDER — MEPERIDINE HCL 25 MG/ML IJ SOLN
INTRAMUSCULAR | Status: AC
  Filled 2017-12-03: qty 1

## 2017-12-03 MED ORDER — ONDANSETRON HCL 4 MG/2ML IJ SOLN
INTRAMUSCULAR | Status: DC | PRN
  Administered 2017-12-03: 4 mg via INTRAVENOUS

## 2017-12-03 MED ORDER — PHENYLEPHRINE 8 MG IN D5W 100 ML (0.08MG/ML) PREMIX OPTIME
INJECTION | INTRAVENOUS | Status: DC | PRN
  Administered 2017-12-03: 60 ug/min via INTRAVENOUS

## 2017-12-03 MED ORDER — KETOROLAC TROMETHAMINE 30 MG/ML IJ SOLN
30.0000 mg | Freq: Four times a day (QID) | INTRAMUSCULAR | Status: AC | PRN
  Administered 2017-12-03: 30 mg via INTRAMUSCULAR

## 2017-12-03 MED ORDER — WITCH HAZEL-GLYCERIN EX PADS: 1.0000 "application " | MEDICATED_PAD | CUTANEOUS | Status: DC | PRN

## 2017-12-03 MED ORDER — NALOXONE HCL 4 MG/10ML IJ SOLN: 1.0000 ug/kg/h | INTRAVENOUS | Status: DC | PRN

## 2017-12-03 MED ORDER — LACTATED RINGERS IV SOLN
INTRAVENOUS | Status: DC
  Administered 2017-12-03: 20:00:00 via INTRAVENOUS

## 2017-12-03 MED ORDER — ZOLPIDEM TARTRATE 5 MG PO TABS: 5.0000 mg | ORAL_TABLET | Freq: Every evening | ORAL | Status: DC | PRN

## 2017-12-03 MED ORDER — EPHEDRINE SULFATE 50 MG/ML IJ SOLN
INTRAMUSCULAR | Status: DC | PRN
  Administered 2017-12-03 (×3): 5 mg via INTRAVENOUS

## 2017-12-03 MED ORDER — NALBUPHINE HCL 10 MG/ML IJ SOLN: 5.0000 mg | INTRAMUSCULAR | Status: DC | PRN

## 2017-12-03 MED ORDER — OXYCODONE HCL 5 MG PO TABS
5.0000 mg | ORAL_TABLET | ORAL | Status: DC | PRN
  Administered 2017-12-04 – 2017-12-06 (×3): 5 mg via ORAL
  Filled 2017-12-03 (×3): qty 1

## 2017-12-03 MED ORDER — DEXAMETHASONE SODIUM PHOSPHATE 10 MG/ML IJ SOLN
INTRAMUSCULAR | Status: AC
  Filled 2017-12-03: qty 1

## 2017-12-03 MED ORDER — KETOROLAC TROMETHAMINE 30 MG/ML IJ SOLN
INTRAMUSCULAR | Status: AC
  Filled 2017-12-03: qty 1

## 2017-12-03 MED ORDER — SIMETHICONE 80 MG PO CHEW: 80.0000 mg | CHEWABLE_TABLET | ORAL | Status: DC | PRN

## 2017-12-03 MED ORDER — OXYTOCIN 10 UNIT/ML IJ SOLN
INTRAMUSCULAR | Status: AC
  Filled 2017-12-03: qty 4

## 2017-12-03 MED ORDER — FENTANYL CITRATE (PF) 100 MCG/2ML IJ SOLN
INTRAMUSCULAR | Status: AC
  Filled 2017-12-03: qty 2

## 2017-12-03 MED ORDER — TETANUS-DIPHTH-ACELL PERTUSSIS 5-2.5-18.5 LF-MCG/0.5 IM SUSP: 0.5000 mL | Freq: Once | INTRAMUSCULAR | Status: DC

## 2017-12-03 MED ORDER — SODIUM CHLORIDE 0.9 % IR SOLN
Status: DC | PRN
  Administered 2017-12-03: 1000 mL

## 2017-12-03 MED ORDER — SIMETHICONE 80 MG PO CHEW
80.0000 mg | CHEWABLE_TABLET | Freq: Three times a day (TID) | ORAL | Status: DC
  Administered 2017-12-03 – 2017-12-06 (×8): 80 mg via ORAL
  Filled 2017-12-03 (×9): qty 1

## 2017-12-03 MED ORDER — SCOPOLAMINE 1 MG/3DAYS TD PT72
1.0000 | MEDICATED_PATCH | Freq: Once | TRANSDERMAL | Status: DC
  Administered 2017-12-03: 1.5 mg via TRANSDERMAL
  Filled 2017-12-03: qty 1

## 2017-12-03 MED ORDER — DEXAMETHASONE SODIUM PHOSPHATE 10 MG/ML IJ SOLN
INTRAMUSCULAR | Status: DC | PRN
  Administered 2017-12-03: 10 mg via INTRAVENOUS

## 2017-12-03 MED ORDER — NALBUPHINE HCL 10 MG/ML IJ SOLN: 5.0000 mg | Freq: Once | INTRAMUSCULAR | Status: DC | PRN

## 2017-12-03 MED ORDER — IBUPROFEN 600 MG PO TABS
600.0000 mg | ORAL_TABLET | Freq: Four times a day (QID) | ORAL | Status: DC
  Administered 2017-12-03 – 2017-12-06 (×12): 600 mg via ORAL
  Filled 2017-12-03 (×12): qty 1

## 2017-12-03 MED ORDER — OXYTOCIN 10 UNIT/ML IJ SOLN
INTRAVENOUS | Status: DC | PRN
  Administered 2017-12-03: 40 [IU] via INTRAVENOUS

## 2017-12-03 MED ORDER — CEFAZOLIN SODIUM-DEXTROSE 2-4 GM/100ML-% IV SOLN
2.0000 g | INTRAVENOUS | Status: AC
  Administered 2017-12-03: 2 g via INTRAVENOUS
  Filled 2017-12-03: qty 100

## 2017-12-03 MED ORDER — DIBUCAINE 1 % RE OINT
1.0000 | TOPICAL_OINTMENT | RECTAL | Status: DC | PRN
Start: 2017-12-03 — End: 2017-12-06

## 2017-12-03 MED ORDER — LACTATED RINGERS IV SOLN
INTRAVENOUS | Status: DC
  Administered 2017-12-03 (×3): via INTRAVENOUS

## 2017-12-03 MED ORDER — SOD CITRATE-CITRIC ACID 500-334 MG/5ML PO SOLN
30.0000 mL | Freq: Once | ORAL | Status: AC
  Administered 2017-12-03: 30 mL via ORAL
  Filled 2017-12-03: qty 15

## 2017-12-03 MED ORDER — ACETAMINOPHEN 325 MG PO TABS
650.0000 mg | ORAL_TABLET | ORAL | Status: DC | PRN
  Administered 2017-12-03 – 2017-12-06 (×9): 650 mg via ORAL
  Filled 2017-12-03 (×10): qty 2

## 2017-12-03 MED ORDER — DIPHENHYDRAMINE HCL 25 MG PO CAPS: 25.0000 mg | ORAL_CAPSULE | ORAL | Status: DC | PRN

## 2017-12-03 MED ORDER — BUPIVACAINE IN DEXTROSE 0.75-8.25 % IT SOLN
INTRATHECAL | Status: DC | PRN
  Administered 2017-12-03: 1.6 mL via INTRATHECAL

## 2017-12-03 MED ORDER — COCONUT OIL OIL: 1.0000 "application " | TOPICAL_OIL | Status: DC | PRN

## 2017-12-03 MED ORDER — EPHEDRINE 5 MG/ML INJ
INTRAVENOUS | Status: AC
  Filled 2017-12-03: qty 10

## 2017-12-03 MED ORDER — SENNOSIDES-DOCUSATE SODIUM 8.6-50 MG PO TABS
2.0000 | ORAL_TABLET | ORAL | Status: DC
  Administered 2017-12-04 – 2017-12-05 (×3): 2 via ORAL
  Filled 2017-12-03 (×3): qty 2

## 2017-12-03 MED ORDER — MEPERIDINE HCL 25 MG/ML IJ SOLN: 6.2500 mg | INTRAMUSCULAR | Status: DC | PRN

## 2017-12-03 MED ORDER — KETOROLAC TROMETHAMINE 30 MG/ML IJ SOLN: 30.0000 mg | Freq: Four times a day (QID) | INTRAMUSCULAR | Status: AC | PRN

## 2017-12-03 MED ORDER — STERILE WATER FOR IRRIGATION IR SOLN
Status: DC | PRN
  Administered 2017-12-03: 1000 mL

## 2017-12-03 MED ORDER — MORPHINE SULFATE (PF) 0.5 MG/ML IJ SOLN
INTRAMUSCULAR | Status: AC
Start: 2017-12-03 — End: ?
  Filled 2017-12-03: qty 10

## 2017-12-03 MED ORDER — MENTHOL 3 MG MT LOZG: 1.0000 | LOZENGE | OROMUCOSAL | Status: DC | PRN

## 2017-12-03 MED ORDER — MEPERIDINE HCL 25 MG/ML IJ SOLN
INTRAMUSCULAR | Status: DC | PRN
  Administered 2017-12-03 (×2): 12.5 mg via INTRAVENOUS

## 2017-12-03 MED ORDER — FENTANYL CITRATE (PF) 100 MCG/2ML IJ SOLN: 25.0000 ug | INTRAMUSCULAR | Status: DC | PRN

## 2017-12-03 MED ORDER — PHENYLEPHRINE HCL 10 MG/ML IJ SOLN
INTRAMUSCULAR | Status: DC | PRN
  Administered 2017-12-03 (×2): 80 ug via INTRAVENOUS

## 2017-12-03 SURGICAL SUPPLY — 36 items
BENZOIN TINCTURE PRP APPL 2/3 (GAUZE/BANDAGES/DRESSINGS) ×3 IMPLANT
CHLORAPREP W/TINT 26ML (MISCELLANEOUS) ×3 IMPLANT
CLAMP CORD UMBIL (MISCELLANEOUS) IMPLANT
CLOSURE WOUND 1/2 X4 (GAUZE/BANDAGES/DRESSINGS) ×1
CLOTH BEACON ORANGE TIMEOUT ST (SAFETY) ×3 IMPLANT
DRAPE C SECTION CLR SCREEN (DRAPES) ×3 IMPLANT
DRSG OPSITE POSTOP 4X10 (GAUZE/BANDAGES/DRESSINGS) ×3 IMPLANT
ELECT REM PT RETURN 9FT ADLT (ELECTROSURGICAL) ×3
ELECTRODE REM PT RTRN 9FT ADLT (ELECTROSURGICAL) ×1 IMPLANT
EXTRACTOR VACUUM KIWI (MISCELLANEOUS) IMPLANT
GLOVE BIO SURGEON STRL SZ 6.5 (GLOVE) ×2 IMPLANT
GLOVE BIO SURGEONS STRL SZ 6.5 (GLOVE) ×1
GLOVE BIOGEL PI IND STRL 7.0 (GLOVE) ×2 IMPLANT
GLOVE BIOGEL PI INDICATOR 7.0 (GLOVE) ×4
GOWN STRL REUS W/TWL LRG LVL3 (GOWN DISPOSABLE) ×6 IMPLANT
KIT ABG SYR 3ML LUER SLIP (SYRINGE) IMPLANT
NEEDLE HYPO 25X5/8 SAFETYGLIDE (NEEDLE) IMPLANT
NS IRRIG 1000ML POUR BTL (IV SOLUTION) ×3 IMPLANT
PACK C SECTION WH (CUSTOM PROCEDURE TRAY) ×3 IMPLANT
PAD OB MATERNITY 4.3X12.25 (PERSONAL CARE ITEMS) ×3 IMPLANT
RETRACTOR WND ALEXIS 25 LRG (MISCELLANEOUS) ×1 IMPLANT
RTRCTR C-SECT PINK 25CM LRG (MISCELLANEOUS) IMPLANT
RTRCTR WOUND ALEXIS 25CM LRG (MISCELLANEOUS) ×3
STRIP CLOSURE SKIN 1/2X4 (GAUZE/BANDAGES/DRESSINGS) ×2 IMPLANT
SUT CHROMIC 1 CTX 36 (SUTURE) ×6 IMPLANT
SUT PLAIN 0 NONE (SUTURE) IMPLANT
SUT PLAIN 2 0 XLH (SUTURE) ×3 IMPLANT
SUT VIC AB 0 CT1 27 (SUTURE) ×4
SUT VIC AB 0 CT1 27XBRD ANBCTR (SUTURE) ×2 IMPLANT
SUT VIC AB 2-0 CT1 27 (SUTURE) ×2
SUT VIC AB 2-0 CT1 TAPERPNT 27 (SUTURE) ×1 IMPLANT
SUT VIC AB 3-0 CT1 27 (SUTURE)
SUT VIC AB 3-0 CT1 TAPERPNT 27 (SUTURE) IMPLANT
SUT VIC AB 4-0 KS 27 (SUTURE) ×3 IMPLANT
TOWEL OR 17X24 6PK STRL BLUE (TOWEL DISPOSABLE) ×3 IMPLANT
TRAY FOLEY W/BAG SLVR 14FR LF (SET/KITS/TRAYS/PACK) ×3 IMPLANT

## 2017-12-03 NOTE — Addendum Note (Signed)
Addendum  created 12/03/17 1754 by Elgie CongoMalinova, Eduardo Honor H, CRNA   Sign clinical note

## 2017-12-03 NOTE — Anesthesia Preprocedure Evaluation (Signed)
Anesthesia Evaluation  Patient identified by MRN, date of birth, ID band Patient awake    Reviewed: Allergy & Precautions, NPO status , Patient's Chart, lab work & pertinent test results  Airway Mallampati: II  TM Distance: >3 FB Neck ROM: Full    Dental no notable dental hx. (+) Teeth Intact   Pulmonary neg pulmonary ROS,    Pulmonary exam normal breath sounds clear to auscultation       Cardiovascular negative cardio ROS Normal cardiovascular exam Rhythm:Regular Rate:Normal     Neuro/Psych negative neurological ROS  negative psych ROS   GI/Hepatic Neg liver ROS, GERD  ,  Endo/Other  negative endocrine ROS  Renal/GU negative Renal ROS  negative genitourinary   Musculoskeletal negative musculoskeletal ROS (+)   Abdominal   Peds  Hematology  (+) anemia ,   Anesthesia Other Findings   Reproductive/Obstetrics (+) Pregnancy                             Anesthesia Physical Anesthesia Plan  ASA: II  Anesthesia Plan: Epidural   Post-op Pain Management:    Induction:   PONV Risk Score and Plan: Scopolamine patch - Pre-op, Dexamethasone, Ondansetron and Treatment may vary due to age or medical condition  Airway Management Planned: Natural Airway  Additional Equipment:   Intra-op Plan:   Post-operative Plan:   Informed Consent: I have reviewed the patients History and Physical, chart, labs and discussed the procedure including the risks, benefits and alternatives for the proposed anesthesia with the patient or authorized representative who has indicated his/her understanding and acceptance.   Dental advisory given  Plan Discussed with: CRNA and Surgeon  Anesthesia Plan Comments:         Anesthesia Quick Evaluation

## 2017-12-03 NOTE — Anesthesia Procedure Notes (Signed)
Spinal  Patient location during procedure: OR End time: 12/03/2017 10:02 AM Staffing Anesthesiologist: Jairo BenJackson, Lennyx Verdell, MD Performed: anesthesiologist  Preanesthetic Checklist Completed: patient identified, surgical consent, pre-op evaluation, timeout performed, IV checked, risks and benefits discussed and monitors and equipment checked Spinal Block Patient position: sitting Prep: site prepped and draped and DuraPrep Patient monitoring: blood pressure, continuous pulse ox, cardiac monitor and heart rate Approach: midline Location: L3-4 Injection technique: single-shot Needle Needle type: Pencan  Needle gauge: 24 G Needle length: 9 cm Additional Notes Pt identified in Operating room.  Monitors applied. Working IV access confirmed. Sterile prep, drape lumbar spine.  1% lido local L 3,4.  #24ga Pencan into clear CSF L 3,4.  12mg  0.75% Bupivacaine with dextrose, morphine, fentanyl injected with asp CSF beginning and end of injection.  Patient asymptomatic, VSS, no heme aspirated, tolerated well.  Sandford Craze Kenney Going, MD

## 2017-12-03 NOTE — Anesthesia Postprocedure Evaluation (Signed)
Anesthesia Post Note  Patient: Anne-Fleur Picker  Procedure(s) Performed: REPEAT CESAREAN SECTION (N/A )     Patient location during evaluation: PACU Anesthesia Type: Spinal Level of consciousness: awake and alert, oriented and patient cooperative Pain management: pain level controlled Vital Signs Assessment: post-procedure vital signs reviewed and stable Respiratory status: spontaneous breathing, nonlabored ventilation and respiratory function stable Cardiovascular status: blood pressure returned to baseline and stable Postop Assessment: spinal receding, patient able to bend at knees and no apparent nausea or vomiting Anesthetic complications: no    Last Vitals:  Vitals:   12/03/17 1256 12/03/17 1423  BP: 107/70 112/66  Pulse: 62 60  Resp: 16 18  Temp: (!) 36.4 C 36.4 C  SpO2: 100% 100%    Last Pain:  Vitals:   12/03/17 1423  TempSrc: Axillary  PainSc: 0-No pain   Pain Goal:                 India Jolin,E. Miking Usrey

## 2017-12-03 NOTE — Transfer of Care (Signed)
Immediate Anesthesia Transfer of Care Note  Patient: Rachel Berry  Procedure(s) Performed: REPEAT CESAREAN SECTION (N/A )  Patient Location: PACU  Anesthesia Type:Spinal  Level of Consciousness: awake, alert  and oriented  Airway & Oxygen Therapy: Patient Spontanous Breathing  Post-op Assessment: Report given to RN and Post -op Vital signs reviewed and stable  Post vital signs: Reviewed and stable  Last Vitals:  Vitals Value Taken Time  BP    Temp    Pulse    Resp    SpO2      Last Pain:  Vitals:   12/03/17 0803  TempSrc: Oral  PainSc: 0-No pain         Complications: No apparent anesthesia complications

## 2017-12-03 NOTE — Interval H&P Note (Signed)
History and Physical Interval Note:  12/03/2017 9:38 AM  Rachel Berry  has presented today for surgery, with the diagnosis of repeat cesarean section  The various methods of treatment have been discussed with the patient and family. After consideration of risks, benefits and other options for treatment, the patient has consented to  Procedure(s) with comments: REPEAT CESAREAN SECTION (N/A) - Doctor Request RNFA as a surgical intervention .  The patient's history has been reviewed, patient examined, no change in status, stable for surgery.  I have reviewed the patient's chart and labs.  Questions were answered to the patient's satisfaction.     Cathrine Musterecilia W Sway Guttierrez

## 2017-12-03 NOTE — Anesthesia Postprocedure Evaluation (Signed)
Anesthesia Post Note  Patient: Rachel Berry  Procedure(s) Performed: REPEAT CESAREAN SECTION (N/A )     Patient location during evaluation: Mother Baby Anesthesia Type: Spinal Level of consciousness: awake and alert Pain management: pain level controlled Vital Signs Assessment: post-procedure vital signs reviewed and stable Respiratory status: spontaneous breathing, nonlabored ventilation and respiratory function stable Cardiovascular status: stable Postop Assessment: no headache, no backache, spinal receding, able to ambulate, adequate PO intake, no apparent nausea or vomiting and patient able to bend at knees Anesthetic complications: no    Last Vitals:  Vitals:   12/03/17 1529 12/03/17 1651  BP: 100/68   Pulse: 67   Resp: 17 18  Temp: 36.5 C 36.7 C  SpO2: 100% 100%    Last Pain:  Vitals:   12/03/17 1651  TempSrc: Axillary  PainSc:    Pain Goal:                 Ossiel Marchio Hristova

## 2017-12-03 NOTE — Op Note (Signed)
Operative Note    Preoperative Diagnosis: 1. IUP at term 2. Requests repeat cesarean section - elective   Postoperative Diagnosis: Same   Procedure: Repeat low transverse cesarean section with double layered closure   Surgeon: Britt BottomBanga C DO  Anesthesia: Spinal  Fluids: LR 3000ml EBL: 1200ml UOP: 300ml   Findings: Viable female infant in cephalic position, grossly normal uterus, tubes and ovaries   Specimen: Placenta to pathology  Procedure Note Consent verified pre-op. All questions answered   Patient was taken to the operating room where spinal anesthesia was administered. She was prepped and draped in the normal sterile fashion and placed in the dorsal supine position with a leftward tilt. An appropriate time out was performed. A Pfannenstiel skin incision was then madecutting out her old scar.The incision was carried through to the underlying layer of fascia by sharp dissection and Bovie cautery. The fascia was nicked in the midline and the incision was extended laterally with Mayo scissors. The superior and inferior aspects of the incision were grasped Coker clamps and dissected off the underlying rectus muscles.Rectus muscles were separated in the midline  and the peritoneal cavity entered bluntly. The peritoneal incision was then extended both superiorly and inferiorly with careful attention to avoid both bowel and bladder. The Alexis self-retaining wound retractor was then placed and the lower uterine segment exposed. The bladder flap was developed with Metzenbaum scissors and pushed away from the lower uterine segment. The lower uterine segment was then incised in a transverse fashion and the cavity itself entered bluntly. The incision was extended bluntly. The infant's head was then lifted and delivered from the incision without difficulty. Vigorous spontaneous cry was noted during delivery.  The remainder of the infant delivered and the nose and mouth bulb suctioned. The cord was  clamped and cut after a minute delay. The infant was handed off to the waiting pediatricians. The placenta was then spontaneously expressed from the uterus and the uterus cleared of all clots and debris with moist lap sponge. The uterine incision was then repaired in 2 layers the first layer was a running locked layer 1-0 chromic and the second an imbricating layer of the same suture. The tubes and ovaries were inspected and the gutters cleared of all clots and debris. The uterine incision was inspected and found to be hemostatic. All instruments and sponges were then removed from the abdomen. The peritoneum was then reapproximated in a purse string fashion with sutures of 2-0 Vicryl. The rectus muscles were reapproximated next in a loose figure 8 stitch using the same suture. The fascia was then closed with 0 Vicryl in a running fashion. Subcutaneous tissue was reapproximated with 3-0 plain in a running fashion. The skin was closed with a subcuticular stitch of 4-0 Vicryl on a Keith needle and then reinforced with benzoin and Steri-Strips. At the conclusion of the procedure all instruments and sponge counts were correct. Patient was taken to the recovery room in good condition with her baby accompanying her skin to skin.  Apgars 9,9

## 2017-12-04 ENCOUNTER — Other Ambulatory Visit: Payer: Self-pay

## 2017-12-04 LAB — CBC
HEMATOCRIT: 21.5 % — AB (ref 36.0–46.0)
HEMOGLOBIN: 7.2 g/dL — AB (ref 12.0–15.0)
MCH: 27.9 pg (ref 26.0–34.0)
MCHC: 33.5 g/dL (ref 30.0–36.0)
MCV: 83.3 fL (ref 78.0–100.0)
Platelets: 233 10*3/uL (ref 150–400)
RBC: 2.58 MIL/uL — AB (ref 3.87–5.11)
RDW: 13.6 % (ref 11.5–15.5)
WBC: 17.1 10*3/uL — AB (ref 4.0–10.5)

## 2017-12-04 NOTE — Progress Notes (Signed)
Patient rating pain a 9/10 and saying she is unable to get OOB and take care of baby (feed/change diapers) due to pain. Patient is requesting her pain medications around 3 hours post last dose. RN encouraged mom to take two PRN oxy to control pain so she can take care of her baby, but she refused. RN then asked mom to see if family can come sit with her to help her recover but still provide care to baby, and she said family is unable to do so. RN has encouraged her to walk and move more to prevent gas. Patient has ambulated to the bathroom three times today. RN will continue to encourage pain medication and ambulation. Royston CowperIsley, Yuridiana Formanek E, RN

## 2017-12-04 NOTE — Progress Notes (Signed)
Patient ID: Rachel Berry, female   DOB: 10/08/1980, 37 y.o.   MRN: [id number] POD#1 Pt doing well with no complaints. Incision pain as expected. Lochia mild, bonding well with baby - breastfeeding VS 90/56   17.1>7.2<233  A/P: POD#1 stable         Routine pp/post op care         Circ later this afternoon

## 2017-12-04 NOTE — Lactation Note (Signed)
This note was copied from a baby's chart. Lactation Consultation Note  Patient Name: Rachel Berry Today's Date: 12/04/2017 Reason for consult: Initial assessment;Term Breastfeeding consultation services and support information given to patient.  This is mom's second baby.  She stopped breastfeeding her first at two weeks due to nipple pain.  She chooses to both breastfeed and formula feed newborn.  Baby is 25 hours old.  Mom reports baby is latching easily but she worries he is not getting milk.  Discussed colostrum and milk coming to volume.  Instructed to feed with cues and call out for assist prn.  Maternal Data Does the patient have breastfeeding experience prior to this delivery?: Yes  Feeding Feeding Type: Bottle Fed - Formula Nipple Type: Slow - flow Length of feed: 10 min  LATCH Score Latch: Repeated attempts needed to sustain latch, nipple held in mouth throughout feeding, stimulation needed to elicit sucking reflex.  Audible Swallowing: A few with stimulation  Type of Nipple: Everted at rest and after stimulation  Comfort (Breast/Nipple): Soft / non-tender  Hold (Positioning): Assistance needed to correctly position infant at breast and maintain latch.  LATCH Score: 7  Interventions    Lactation Tools Discussed/Used     Consult Status Consult Status: Follow-up Date: 12/05/17 Follow-up type: In-patient    Huston FoleyMOULDEN, Vietta Bonifield S 12/04/2017, 11:39 AM

## 2017-12-05 LAB — COMPREHENSIVE METABOLIC PANEL
ALBUMIN: 2.8 g/dL — AB (ref 3.5–5.0)
ALK PHOS: 100 U/L (ref 38–126)
ALT: 17 U/L (ref 0–44)
AST: 29 U/L (ref 15–41)
Anion gap: 10 (ref 5–15)
BUN: 6 mg/dL (ref 6–20)
CALCIUM: 8.8 mg/dL — AB (ref 8.9–10.3)
CO2: 22 mmol/L (ref 22–32)
Chloride: 105 mmol/L (ref 98–111)
Creatinine, Ser: 0.68 mg/dL (ref 0.44–1.00)
GFR calc Af Amer: >60 mL/min (ref >60)
GFR calc non Af Amer: >60 mL/min (ref >60)
GLUCOSE: 164 mg/dL — AB (ref 70–99)
Potassium: 3.8 mmol/L (ref 3.5–5.1)
Sodium: 137 mmol/L (ref 135–145)
TOTAL PROTEIN: 6.2 g/dL — AB (ref 6.5–8.1)
Total Bilirubin: 0.4 mg/dL (ref 0.3–1.2)

## 2017-12-05 LAB — CBC
HEMATOCRIT: 26.9 % — AB (ref 36.0–46.0)
HEMOGLOBIN: 8.8 g/dL — AB (ref 12.0–15.0)
MCH: 27.9 pg (ref 26.0–34.0)
MCHC: 32.7 g/dL (ref 30.0–36.0)
MCV: 85.4 fL (ref 78.0–100.0)
Platelets: 266 10*3/uL (ref 150–400)
RBC: 3.15 MIL/uL — ABNORMAL LOW (ref 3.87–5.11)
RDW: 14.1 % (ref 11.5–15.5)
WBC: 13.9 10*3/uL — AB (ref 4.0–10.5)

## 2017-12-05 NOTE — Progress Notes (Signed)
POD #2 Doing ok, pain still there but improved Afeb, VSS Abd- soft, fundus firm, incision intact Continue routine care, encouraged ambulation

## 2017-12-05 NOTE — Progress Notes (Addendum)
   12/05/17 1639  Vital Signs  BP 137/77  BP Location Right Arm  Patient Position (if appropriate) Semi-fowlers  BP Method Automatic  Pulse Rate 87  Resp 20  Temp (!) 97.3 F (36.3 C)  Temp Source Axillary  Oxygen Therapy  SpO2 100 %  Pt called RN to room stating she didn't feel well; overwhelmed by husband and daughter being here earlier, and she was afraid she couldn't take care of the baby herself. She was pale and requested food. Pt drank several cups of  Juice, had crackers and cream cheese then her food arrived. Her physical assessment was the same as this morning's. Hyperactive bowel sounds and WDL for everything else. MD Meisinger notified, Labs ordered stat. Pt didn't feel like she could walk to bathroom so used the bedpan to empty bladder. Pt's facial color improved.   18:04 results called to Dr Jackelyn KnifeMeisinger. Requested call again if pt worsens.

## 2017-12-06 MED ORDER — OXYCODONE HCL 5 MG PO TABS: 5.0000 mg | ORAL_TABLET | ORAL | 0 refills | Status: DC | PRN

## 2017-12-06 MED ORDER — IBUPROFEN 600 MG PO TABS: 600.0000 mg | ORAL_TABLET | Freq: Four times a day (QID) | ORAL | 0 refills | Status: DC

## 2017-12-06 MED ORDER — ACETAMINOPHEN 325 MG PO TABS: 650.0000 mg | ORAL_TABLET | ORAL | 0 refills | Status: DC | PRN

## 2017-12-06 NOTE — Progress Notes (Signed)
Patient vitals OK, instructed patient on her care and signs of infection and when to call the doctor

## 2017-12-06 NOTE — Discharge Summary (Signed)
OB Discharge Summary     Patient Name: Rachel Berry DOB: 04-May-1981 MRN: [id number]  Date of admission: 12/03/2017 Delivering MD: Pryor OchoaBANGA, [facility]ECILIA [facility]   Date of discharge: 12/06/2017  Admitting diagnosis: repeat c-section Intrauterine pregnancy: 4341w1d     Secondary diagnosis:  Active Problems:   Postpartum care following cesarean delivery   Term pregnancy, repeat   Status post repeat low transverse cesarean section  Additional problems: mild anemia     Discharge diagnosis: Term Pregnancy Delivered                                                                                                Post partum procedures:none  Complications: None  Hospital course:  Sceduled C/S   37 y.o. yo [id number] at 141w1d was admitted to the hospital 12/03/2017 for scheduled cesarean section with the following indication:Elective Repeat.  Membrane Rupture Time/Date: 10:24 AM ,12/03/2017   Patient delivered a Viable infant.12/03/2017  Details of operation can be found in separate operative note.  Pateint had an uncomplicated postpartum course.  She is ambulating, tolerating a regular diet, passing flatus, and urinating well. Patient is discharged home in stable condition on  12/06/17         Physical exam  Vitals:   12/05/17 1426 12/05/17 1639 12/05/17 2344 12/06/17 0516  BP: 105/66 137/77 92/63 99/62   Pulse: 71 87 61 62  Resp: 17 20 19 20   Temp: 98 F (36.7 C) (!) 97.3 F (36.3 C) 98.6 F (37 C) 98.6 F (37 C)  TempSrc:  Axillary Oral Oral  SpO2:  100% 100%   Weight:      Height:       General: alert and cooperative Lochia: appropriate Uterine Fundus: firm Incision: Dressing is clean, dry, and intact  Labs: Lab Results  Component Value Date   WBC 13.9 (H) 12/05/2017   HGB 8.8 (L) 12/05/2017   HCT 26.9 (L) 12/05/2017   MCV 85.4 12/05/2017   PLT 266 12/05/2017   CMP Latest Ref Rng & Units 12/05/2017  Glucose 70 - 99 mg/dL 308(M164(H)  BUN 6 - 20 mg/dL 6  Creatinine 5.780.44 -  4.691.00 mg/dL 6.290.68  Sodium 528135 - 413145 mmol/L 137  Potassium 3.5 - 5.1 mmol/L 3.8  Chloride 98 - 111 mmol/L 105  CO2 22 - 32 mmol/L 22  Calcium 8.9 - 10.3 mg/dL 2.4(M8.8(L)  Total Protein 6.5 - 8.1 g/dL 6.2(L)  Total Bilirubin 0.3 - 1.2 mg/dL 0.4  Alkaline Phos 38 - 126 U/L 100  AST 15 - 41 U/L 29  ALT 0 - 44 U/L 17    Discharge instruction: per After Visit Summary and "Baby and Me Booklet".  After visit meds:  Allergies as of 12/06/2017      Reactions   Tamiflu [oseltamivir Phosphate] Rash      Medication List    TAKE these medications   acetaminophen 325 MG tablet Commonly known as:  TYLENOL Take 2 tablets (650 mg total) by mouth every 4 (four) hours as needed (for pain scale < 4).   ibuprofen 600 MG tablet Commonly known as:  ADVIL,MOTRIN  Take 1 tablet (600 mg total) by mouth every 6 (six) hours.   IRON PO Take 10 mg by mouth 2 (two) times a week. Chew 1 gummy (10 mg) by mouth two times a week   multivitamin-prenatal 27-0.8 MG Tabs tablet Take 1 tablet by mouth every other day.   oxyCODONE 5 MG immediate release tablet Commonly known as:  Oxy IR/ROXICODONE Take 1 tablet (5 mg total) by mouth every 4 (four) hours as needed (pain scale 4-7).       Diet: routine diet  Activity: Advance as tolerated. Pelvic rest for 6 weeks.   Outpatient follow up:2 weeks Follow up Appt:No future appointments. Follow up Visit:No follow-ups on file.  Postpartum contraception: Undecided  Newborn Data: Live born female  Birth Weight: 7 lb 8.8 oz (3425 g) APGAR: 9, 9  Newborn Delivery   Birth date/time:  12/03/2017 10:25:00 Delivery type:  C-Section, Low Transverse Trial of labor:  No C-section categorization:  Repeat     Baby Feeding: Bottle and Breast Disposition:home with mother   12/06/2017 Oliver Pila, MD

## 2017-12-06 NOTE — Progress Notes (Signed)
Pt c/o breast tenderness. Pt's breast hot to touch and very firm; veining also noted. Informed pt that she seems to be getting engorged and asked if she would allow nurse to help her latch baby for feeding or attempt to pump off some milk. Pt agreed to pumping. This RN went to get pt a hand pump and pt states her breast are too painful to pump. Encouraged pt to hand express; pt declined. This RN brought pt ice packs to apply to breast.

## 2017-12-06 NOTE — Progress Notes (Signed)
Subjective: Postpartum Day 3: Cesarean Delivery Patient reports tolerating PO and no problems voiding.  Pt feeling better than yesterday PM, states feels a little loopy and ears congested.  Ambulating fine.  Took oxycodone this AM on empty stomach  Objective: Vital signs in last 24 hours: Temp:  [97.3 F (36.3 C)-98.6 F (37 C)] 98.6 F (37 C) (07/25 0516) Pulse Rate:  [61-87] 62 (07/25 0516) Resp:  [17-20] 20 (07/25 0516) BP: (92-137)/(62-77) 99/62 (07/25 0516) SpO2:  [100 %] 100 % (07/24 2344)  Physical Exam:  General: alert and cooperative Lochia: appropriate Uterine Fundus: firm Incision: C/D/I   Recent Labs    12/04/17 0544 12/05/17 1727  HGB 7.2* 8.8*  HCT 21.5* 26.9*    Assessment/Plan: Status post Cesarean section.  Stable hemoglobin on recheck last PM.  D/w pt taking pain medications with food.  States ready for d/c home and has mother to help at home.   Oliver PilaKathy W Geraldine Tesar 12/06/2017, 9:17 AM

## 2021-06-17 ENCOUNTER — Encounter: Payer: Self-pay | Admitting: Gastroenterology

## 2021-06-28 ENCOUNTER — Ambulatory Visit: Payer: 59 | Admitting: Gastroenterology

## 2021-06-28 ENCOUNTER — Encounter: Payer: Self-pay | Admitting: Gastroenterology

## 2021-06-28 ENCOUNTER — Other Ambulatory Visit (INDEPENDENT_AMBULATORY_CARE_PROVIDER_SITE_OTHER): Payer: 59

## 2021-06-28 VITALS — BP 90/58 | HR 77 | Ht 65.0 in | Wt 132.0 lb

## 2021-06-28 DIAGNOSIS — R1012 Left upper quadrant pain: Secondary | ICD-10-CM

## 2021-06-28 LAB — CBC WITH DIFFERENTIAL/PLATELET
Basophils Absolute: 0 10*3/uL (ref 0.0–0.1)
Basophils Relative: 0.7 % (ref 0.0–3.0)
Eosinophils Absolute: 0.1 10*3/uL (ref 0.0–0.7)
Eosinophils Relative: 1.1 % (ref 0.0–5.0)
HCT: 39.2 % (ref 36.0–46.0)
Hemoglobin: 13 g/dL (ref 12.0–15.0)
Lymphocytes Relative: 48.6 % — ABNORMAL HIGH (ref 12.0–46.0)
Lymphs Abs: 3.2 10*3/uL (ref 0.7–4.0)
MCHC: 33.1 g/dL (ref 30.0–36.0)
MCV: 89.3 fl (ref 78.0–100.0)
Monocytes Absolute: 0.4 10*3/uL (ref 0.1–1.0)
Monocytes Relative: 6.1 % (ref 3.0–12.0)
Neutro Abs: 2.8 10*3/uL (ref 1.4–7.7)
Neutrophils Relative %: 43.5 % (ref 43.0–77.0)
Platelets: 304 10*3/uL (ref 150.0–400.0)
RBC: 4.39 Mil/uL (ref 3.87–5.11)
RDW: 12.8 % (ref 11.5–15.5)
WBC: 6.5 10*3/uL (ref 4.0–10.5)

## 2021-06-28 NOTE — Patient Instructions (Signed)
If you are age 39 or older, your body mass index should be between 23-30. Your Body mass index is 21.97 kg/m. If this is out of the aforementioned range listed, please consider follow up with your Primary Care Provider.  If you are age 31 or younger, your body mass index should be between 19-25. Your Body mass index is 21.97 kg/m. If this is out of the aformentioned range listed, please consider follow up with your Primary Care Provider.   ________________________________________________________  The [place] GI providers would like to encourage you to use [facility] to communicate with providers for non-urgent requests or questions.  Due to long hold times on the telephone, sending your provider a message by [facility] may be a faster and more efficient way to get a response.  Please allow 48 business hours for a response.  Please remember that this is for non-urgent requests.  _______________________________________________________  Your provider has requested that you go to the basement level for lab work before leaving today. Press "B" on the elevator. The lab is located at the first door on the left as you exit the elevator.  You will be contacted by [facility] Scheduling in the next 2 days to arrange a Right upper quadrant ultrasound.  The number on your caller ID will be [phone], please answer when they call.  If you have not heard from them in 2 days please call [phone] to schedule.     Due to recent changes in healthcare laws, you may see the results of your imaging and laboratory studies on MyChart before your provider has had a chance to review them.  We understand that in some cases there may be results that are confusing or concerning to you. Not all laboratory results come back in the same time frame and the provider may be waiting for multiple results in order to interpret others.  Please give [place] 48 hours in order for your provider to thoroughly review all the results  before contacting the office for clarification of your results.   It was a pleasure to see you today!  Thank you for trusting me with your gastrointestinal care!

## 2021-06-28 NOTE — Progress Notes (Signed)
[facility] Consult Note:  History: Rachel Berry 06/28/2021  Referring provider: Carlynn Purl, MD  Reason for consult/chief complaint: Abdominal Pain (Pt reports intermittent left sided abdominal pain/cramping; sometimes triggered by spicy food)   Subjective  HPI: Rachel was referred by her gynecologist for abdominal pain.  She recalls many years ago during an episode of GI illness that she had some intermittent crampy left upper quadrant pain afterwards, then it finally resolved. About a month ago she had some digestive upset with abdominal gas and brief nonbloody diarrhea.  Since then she has had some episodic brief pinching and cramping left upper quadrant pain.  Bowel habits now normal, no rectal bleeding, no nausea vomiting dysphagia odynophagia, early satiety or weight loss. She sent away and Rachel Berry food test then says there were no clear abnormalities on it.  She contacted Dr. Terri Berry who then referred her here.  ROS:  Review of Systems  Constitutional:  Negative for appetite change and unexpected weight change.  HENT:  Negative for mouth sores and voice change.   Eyes:  Negative for pain and redness.  Respiratory:  Negative for cough and shortness of breath.   Cardiovascular:  Negative for chest pain and palpitations.  Genitourinary:  Negative for dysuria and hematuria.  Musculoskeletal:  Negative for arthralgias and myalgias.  Skin:  Negative for pallor and rash.  Neurological:  Negative for weakness and headaches.  Hematological:  Negative for adenopathy.    Past Medical History: Past Medical History:  Diagnosis Date   Complication of anesthesia    itching   Medical history non-contributory    Missed ab      Past Surgical History: Past Surgical History:  Procedure Laterality Date   CESAREAN SECTION N/A 12/19/2014   Procedure: CESAREAN SECTION;  Surgeon: Sherlyn Hay, DO;  Location: [facility];  Service: Obstetrics;   Laterality: N/A;   CESAREAN SECTION N/A 12/03/2017   Procedure: REPEAT CESAREAN SECTION;  Surgeon: Sherlyn Hay, DO;  Location: [facility];  Service: Obstetrics;  Laterality: N/A;  Doctor Request RNFA   DILATION AND CURETTAGE OF UTERUS N/A 10/30/2013   Procedure: DILATATION AND CURETTAGE;  Surgeon: Logan Bores, MD;  Location: [facility];  Service: Gynecology;  Laterality: N/A;     Family History: History reviewed. No pertinent family history.  Social History: Social History   Socioeconomic History   Marital status: Married    Spouse name: Not on file   Number of children: Not on file   Years of education: Not on file   Highest education level: Not on file  Occupational History   Not on file  Tobacco Use   Smoking status: Never   Smokeless tobacco: Never  Substance and Sexual Activity   Alcohol use: No   Drug use: No   Sexual activity: Yes  Other Topics Concern   Not on file  Social History Narrative   Not on file   Social Determinants of Health   Financial Resource Strain: Not on file  Food Insecurity: Not on file  Transportation Needs: Not on file  Physical Activity: Not on file  Stress: Not on file  Social Connections: Not on file    Allergies: Allergies  Allergen Reactions   Tamiflu [Oseltamivir Phosphate] Rash    Outpatient Meds: Current Outpatient Medications  Medication Sig Dispense Refill   ibuprofen (ADVIL,MOTRIN) 600 MG tablet Take 1 tablet (600 mg total) by mouth every 6 (six) hours. 30 tablet 0   sertraline (ZOLOFT)  50 MG tablet sertraline 50 mg tablet  TAKE 1 TABLET BY MOUTH EVERY 12 HOURS     No current facility-administered medications for this visit.      ___________________________________________________________________ Objective   Exam:  BP (!) 90/58    Pulse 77    Ht 5\' 5"  (1.651 m)    Wt 132 lb (59.9 kg)    BMI 21.97 kg/m  Wt Readings from Last 3 Encounters:  06/28/21 132 lb (59.9 kg)   12/03/17 142 lb (64.4 kg)  11/23/17 142 lb (64.4 kg)   Exam chaperoned by Rachel Berry, CMA General: She is well-appearing Eyes: sclera anicteric, no redness ENT: oral mucosa moist without lesions, no cervical or supraclavicular lymphadenopathy CV: RRR without murmur, S1/S2, no JVD, no peripheral edema Resp: clear to auscultation bilaterally, normal RR and effort noted GI: soft, no tenderness, with active bowel sounds. No guarding or palpable organomegaly noted. Skin; warm and dry, no rash or jaundice noted Neuro: awake, alert and oriented x 3. Normal gross motor function and fluent speech Perianal exam normal, normal DRE, scant heme-negative stool  Data  No recent data for review   Assessment: Encounter Diagnosis  Name Primary?   LUQ abdominal pain Yes    Somewhat difficult to characterize, no associated symptoms, sounds likely intestinal spasm.  Otherwise looks and feels well.  May be difficult to find clear food or other triggers.  Sounds like she may have had some postinfectious IBS-like symptoms in the past.  Plan:  Abdominal ultrasound to rule out any obvious cyst or fluid collection.  CBC, CMP, celiac antibodies today  Trial of hyoscyamine as needed.  No current plans for endoscopic testing.  Thank you for the courtesy of this consult.  Please call me with any questions or concerns.  Rachel Berry III  CC: Referring provider noted above

## 2021-06-29 LAB — COMPREHENSIVE METABOLIC PANEL
ALT: 9 U/L (ref 0–35)
AST: 12 U/L (ref 0–37)
Albumin: 4.7 g/dL (ref 3.5–5.2)
Alkaline Phosphatase: 52 U/L (ref 39–117)
BUN: 12 mg/dL (ref 6–23)
CO2: 27 mEq/L (ref 19–32)
Calcium: 9.6 mg/dL (ref 8.4–10.5)
Chloride: 105 mEq/L (ref 96–112)
Creatinine, Ser: 0.81 mg/dL (ref 0.40–1.20)
GFR: 92.21 mL/min (ref >60.00)
Glucose, Bld: 95 mg/dL (ref 70–99)
Potassium: 4 mEq/L (ref 3.5–5.1)
Sodium: 138 mEq/L (ref 135–145)
Total Bilirubin: 0.3 mg/dL (ref 0.2–1.2)
Total Protein: 7.9 g/dL (ref 6.0–8.3)

## 2021-06-29 LAB — TISSUE TRANSGLUTAMINASE, IGA: (tTG) Ab, IgA: <1 U/mL

## 2021-06-29 LAB — IGA: Immunoglobulin A: 178 mg/dL (ref 47–310)

## 2021-06-30 ENCOUNTER — Encounter: Payer: Self-pay | Admitting: Gastroenterology

## 2021-06-30 MED ORDER — HYOSCYAMINE SULFATE 0.125 MG SL SUBL: 0.1250 mg | SUBLINGUAL_TABLET | Freq: Four times a day (QID) | SUBLINGUAL | 2 refills | Status: DC | PRN

## 2021-07-04 ENCOUNTER — Ambulatory Visit (HOSPITAL_BASED_OUTPATIENT_CLINIC_OR_DEPARTMENT_OTHER)
Admission: RE | Admit: 2021-07-04 | Discharge: 2021-07-04 | Disposition: A | Discharge status: Home / Self Care | Payer: 59 | Source: Ambulatory Visit | Attending: Gastroenterology | Admitting: Gastroenterology

## 2021-07-04 ENCOUNTER — Other Ambulatory Visit: Payer: Self-pay

## 2021-07-04 DIAGNOSIS — R1012 Left upper quadrant pain: Secondary | ICD-10-CM | POA: Insufficient documentation

## 2021-07-05 ENCOUNTER — Encounter: Payer: Self-pay | Admitting: Gastroenterology

## 2022-08-28 IMAGING — US US ABDOMEN COMPLETE
1 series · 14 of 25 positions shown · non-contrast
Comparison: None.

CLINICAL DATA: Left upper quadrant pain.

EXAM:
ABDOMEN ULTRASOUND COMPLETE

[Series 1: us abdomen complete · 14 of 72 slices shown]
[im 1/72]
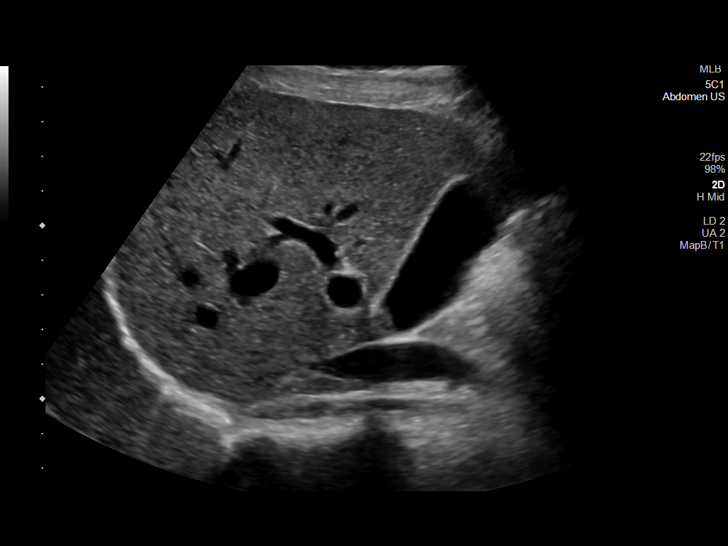
[im 6/72]
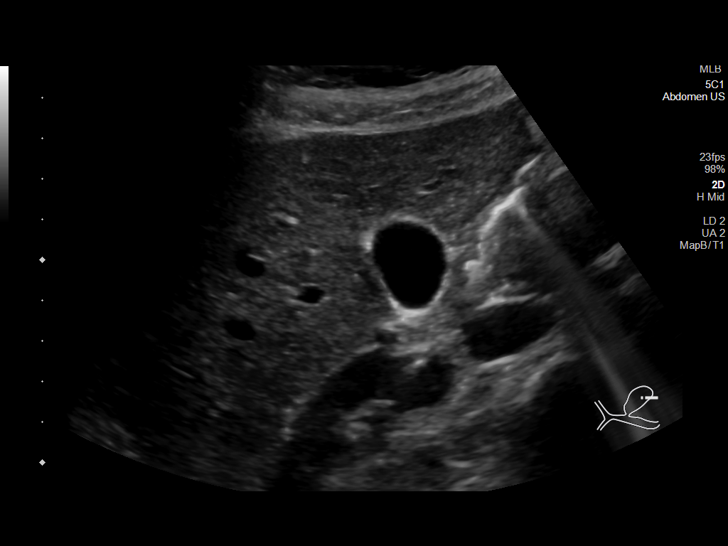
[im 12/72]
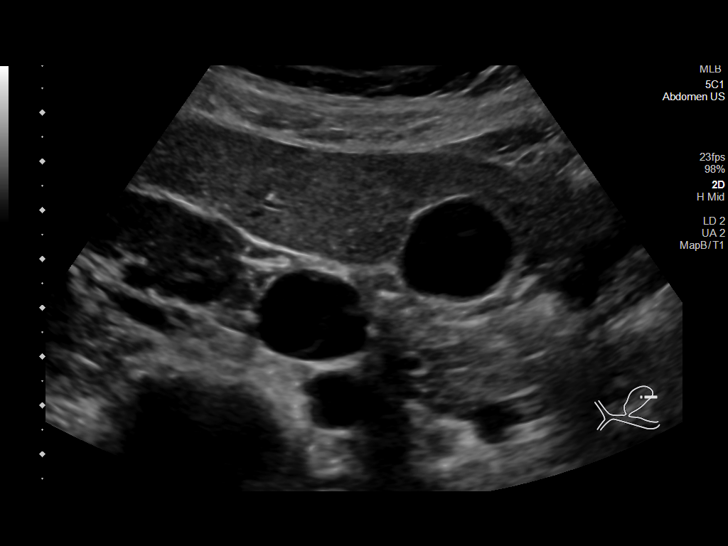
[im 18/72]
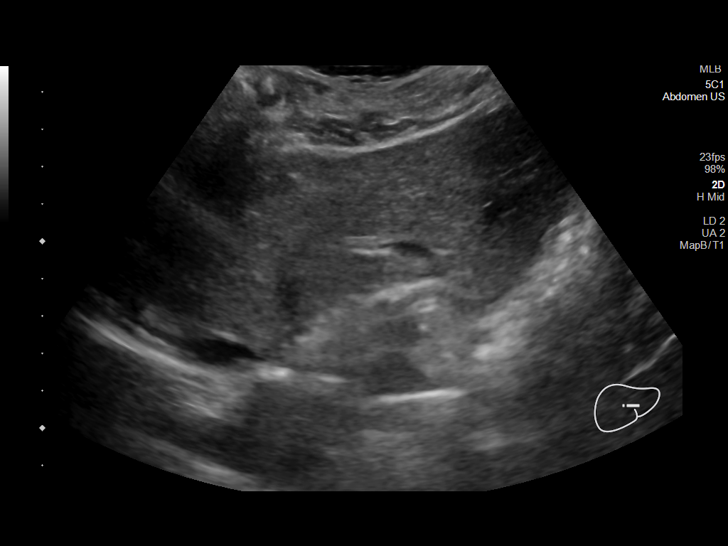
[im 24/72]
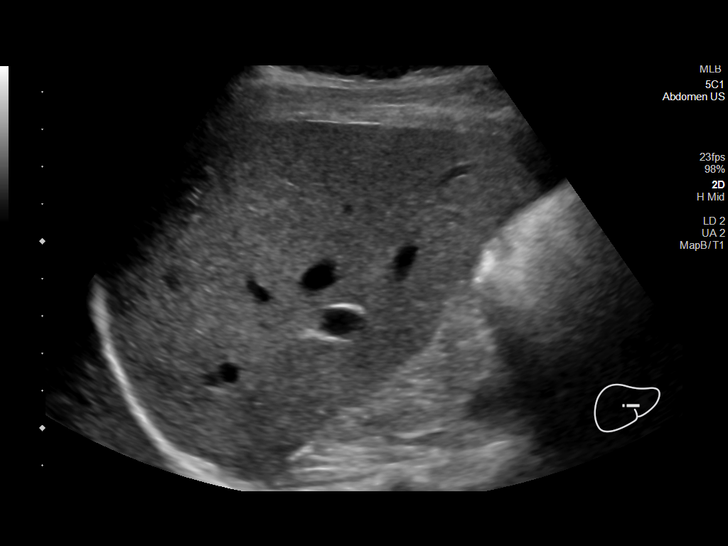
[im 27/72]
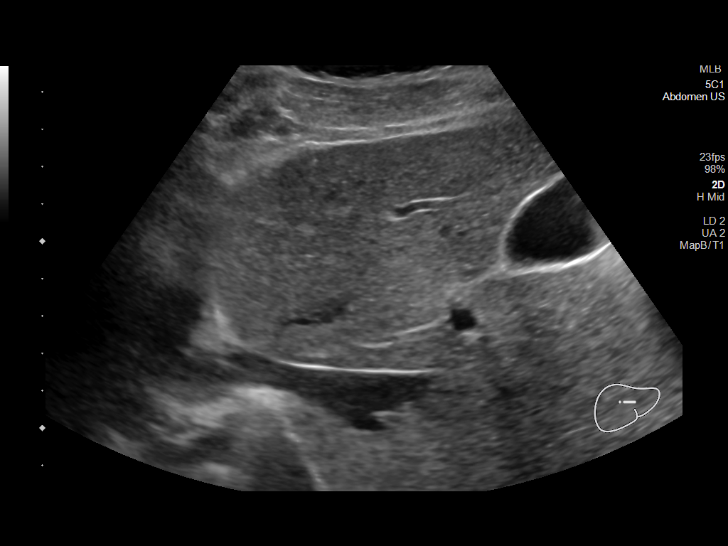
[im 33/72]
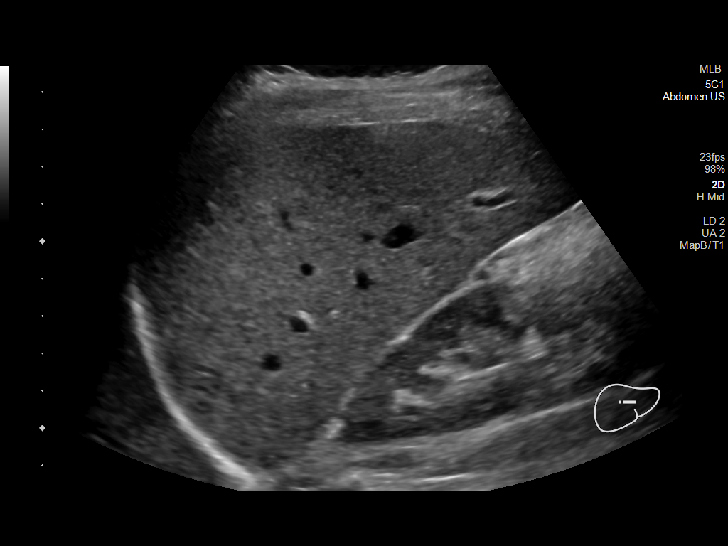
[im 39/72]
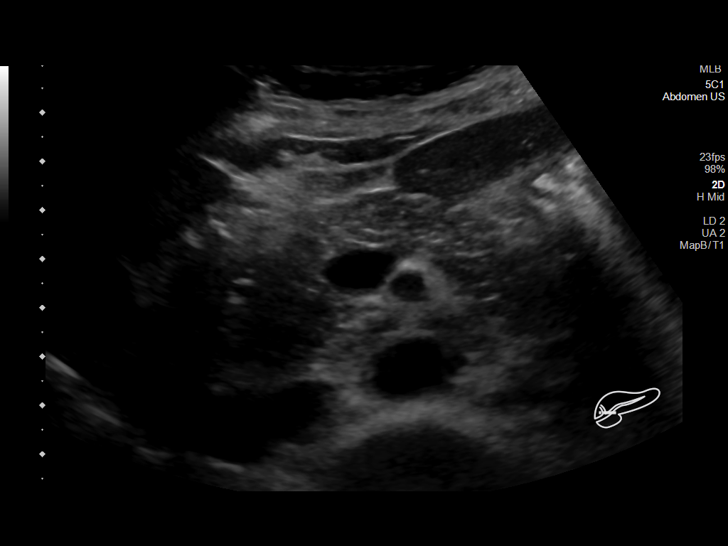
[im 45/72]
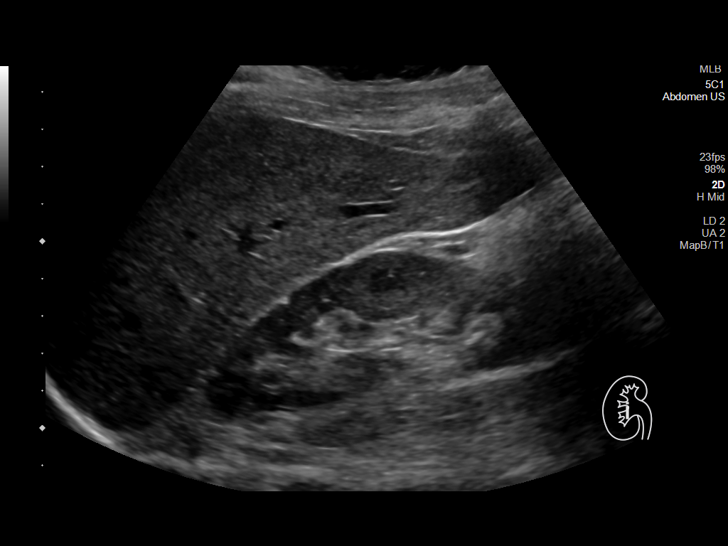
[im 48/72]
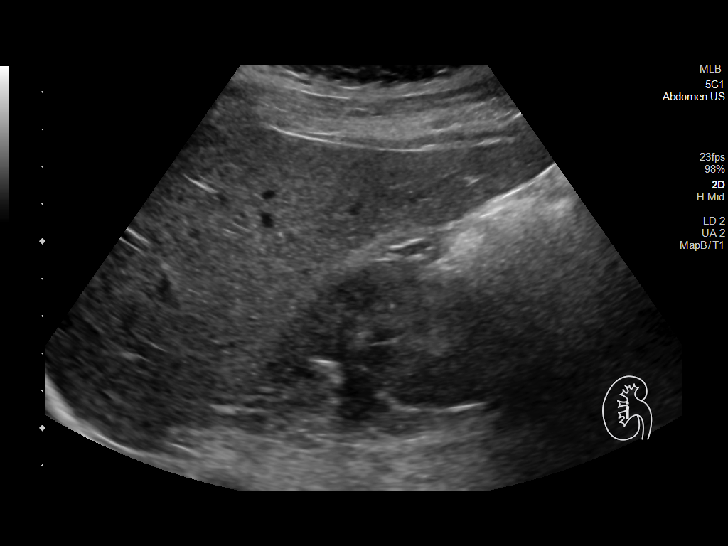
[im 54/72]
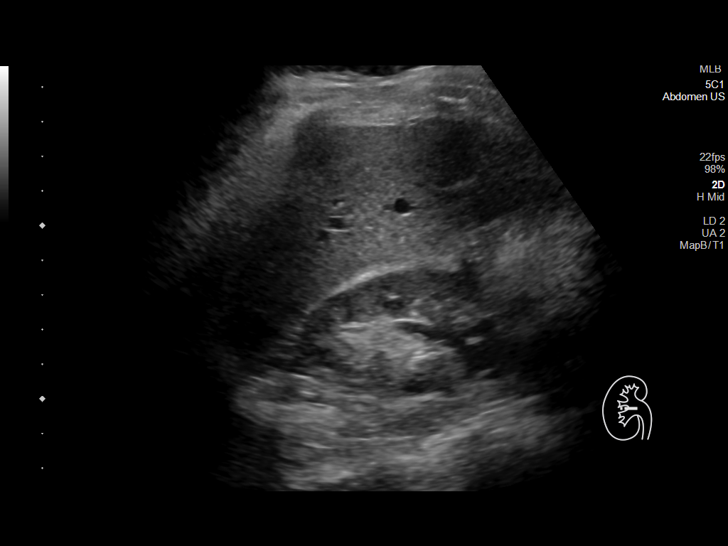
[im 60/72]
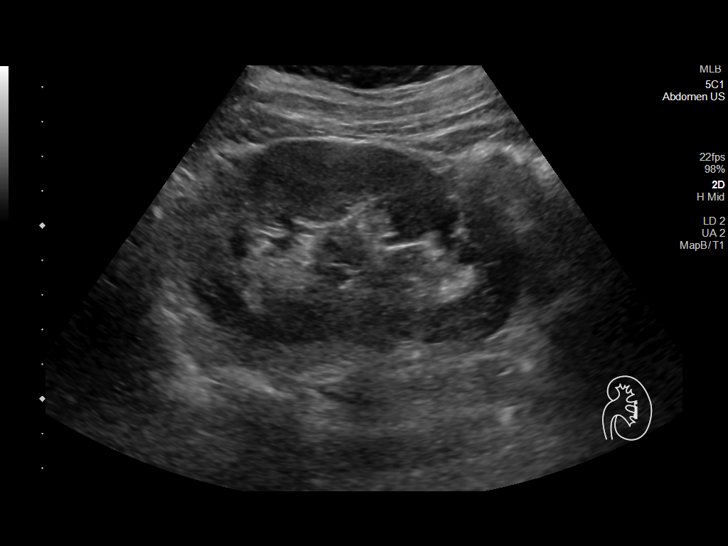
[im 66/72]
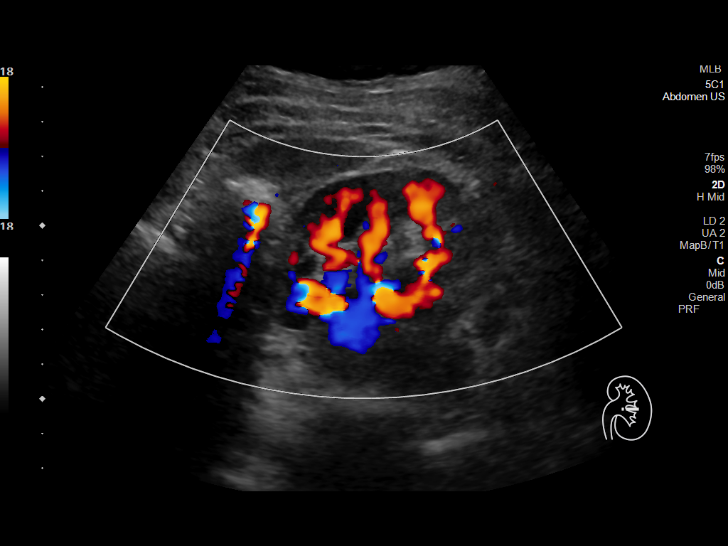
[im 72/72]
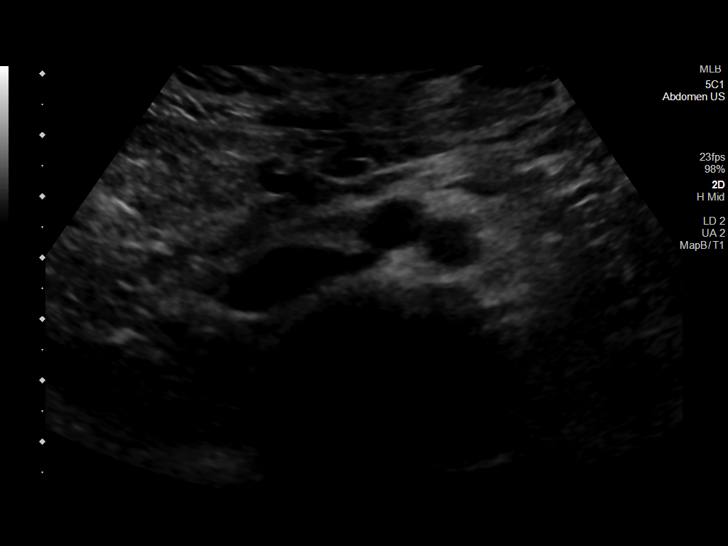

[14 of 25 positions shown; findings below may reference images not displayed]

FINDINGS: Gallbladder: No gallstones or wall thickening visualized. No
sonographic Murphy sign noted by sonographer.

Common bile duct: Diameter: 3 mm.

Liver: No focal lesion identified. Within normal limits in
parenchymal echogenicity. Portal vein is patent on color Doppler
imaging with normal direction of blood flow towards the liver.

IVC: No abnormality visualized.

Pancreas: Visualized portion unremarkable.

Spleen: Size and appearance within normal limits.

Right Kidney: Length: 10.2 cm. Echogenicity within normal limits. No
mass or hydronephrosis visualized.

Left Kidney: Length: 9.8 cm. Echogenicity within normal limits. No
mass or hydronephrosis visualized.

Abdominal aorta: No aneurysm visualized.

Other findings: None.
IMPRESSION: Unremarkable upper abdominal ultrasound.

## 2022-12-05 LAB — OB RESULTS CONSOLE GC/CHLAMYDIA
Chlamydia: NEGATIVE
Neisseria Gonorrhea: NEGATIVE

## 2022-12-05 LAB — OB RESULTS CONSOLE RUBELLA ANTIBODY, IGM: Rubella: IMMUNE

## 2022-12-05 LAB — OB RESULTS CONSOLE RPR: RPR: NONREACTIVE

## 2022-12-05 LAB — OB RESULTS CONSOLE ANTIBODY SCREEN: Antibody Screen: NEGATIVE

## 2022-12-05 LAB — OB RESULTS CONSOLE HEPATITIS B SURFACE ANTIGEN: Hepatitis B Surface Ag: NEGATIVE

## 2022-12-05 LAB — HEPATITIS C ANTIBODY: HCV Ab: NEGATIVE

## 2022-12-05 LAB — OB RESULTS CONSOLE HIV ANTIBODY (ROUTINE TESTING): HIV: NONREACTIVE

## 2023-02-24 ENCOUNTER — Inpatient Hospital Stay (HOSPITAL_COMMUNITY)
Admission: AD | Admit: 2023-02-24 | Discharge: 2023-02-25 | Disposition: A | Discharge status: Home / Self Care | Payer: Managed Care, Other (non HMO) | Attending: Obstetrics | Admitting: Obstetrics

## 2023-02-24 ENCOUNTER — Encounter (HOSPITAL_COMMUNITY): Payer: Self-pay

## 2023-02-24 DIAGNOSIS — Z3A22 22 weeks gestation of pregnancy: Secondary | ICD-10-CM | POA: Diagnosis not present

## 2023-02-24 DIAGNOSIS — R55 Syncope and collapse: Secondary | ICD-10-CM | POA: Diagnosis present

## 2023-02-24 DIAGNOSIS — O30042 Twin pregnancy, dichorionic/diamniotic, second trimester: Secondary | ICD-10-CM | POA: Diagnosis not present

## 2023-02-24 DIAGNOSIS — O99012 Anemia complicating pregnancy, second trimester: Secondary | ICD-10-CM

## 2023-02-24 DIAGNOSIS — O09522 Supervision of elderly multigravida, second trimester: Secondary | ICD-10-CM | POA: Insufficient documentation

## 2023-02-24 DIAGNOSIS — O26892 Other specified pregnancy related conditions, second trimester: Secondary | ICD-10-CM | POA: Diagnosis not present

## 2023-02-24 LAB — GLUCOSE, CAPILLARY: Glucose-Capillary: 92 mg/dL (ref 70–99)

## 2023-02-24 NOTE — MAU Note (Signed)
.  Rachel Berry is a 42 y.o. at [redacted]w[redacted]d here in MAU reporting: in bed sitting-Di/Di twins/the babies started moving-the movement started her feeling numbness-generalized-with ringing in her ears.unsure if she lost consciousness. Denies uterine cramping or contractions. Denies SROM, vaginal bleeding or discharge.   Onset of complaint: 2130 Pain score: 0 Vitals:   02/24/23 2328  BP: 108/67  Pulse: 87  Resp: 17  SpO2: 100%     WUJ:WJXB A   LMQ 145bpm/   Twin B RLQ 142bpm Lab orders placed from triage:

## 2023-02-25 DIAGNOSIS — Z3A22 22 weeks gestation of pregnancy: Secondary | ICD-10-CM | POA: Diagnosis not present

## 2023-02-25 DIAGNOSIS — R55 Syncope and collapse: Secondary | ICD-10-CM

## 2023-02-25 DIAGNOSIS — O99012 Anemia complicating pregnancy, second trimester: Secondary | ICD-10-CM

## 2023-02-25 DIAGNOSIS — O30042 Twin pregnancy, dichorionic/diamniotic, second trimester: Secondary | ICD-10-CM

## 2023-02-25 LAB — URINALYSIS, ROUTINE W REFLEX MICROSCOPIC
Bilirubin Urine: NEGATIVE
Glucose, UA: NEGATIVE mg/dL
Hgb urine dipstick: NEGATIVE
Ketones, ur: NEGATIVE mg/dL
Leukocytes,Ua: NEGATIVE
Nitrite: NEGATIVE
Protein, ur: NEGATIVE mg/dL
Specific Gravity, Urine: 1.004 — ABNORMAL LOW (ref 1.005–1.030)
pH: 6 (ref 5.0–8.0)

## 2023-02-25 LAB — BASIC METABOLIC PANEL
Anion gap: 9 (ref 5–15)
BUN: 9 mg/dL (ref 6–20)
CO2: 20 mmol/L — ABNORMAL LOW (ref 22–32)
Calcium: 8.6 mg/dL — ABNORMAL LOW (ref 8.9–10.3)
Chloride: 104 mmol/L (ref 98–111)
Creatinine, Ser: 0.81 mg/dL (ref 0.44–1.00)
GFR, Estimated: >60 mL/min (ref >60)
Glucose, Bld: 120 mg/dL — ABNORMAL HIGH (ref 70–99)
Potassium: 3.3 mmol/L — ABNORMAL LOW (ref 3.5–5.1)
Sodium: 133 mmol/L — ABNORMAL LOW (ref 135–145)

## 2023-02-25 LAB — CBC
HCT: 25.3 % — ABNORMAL LOW (ref 36.0–46.0)
Hemoglobin: 8.5 g/dL — ABNORMAL LOW (ref 12.0–15.0)
MCH: 28.2 pg (ref 26.0–34.0)
MCHC: 33.6 g/dL (ref 30.0–36.0)
MCV: 84.1 fL (ref 80.0–100.0)
Platelets: 267 10*3/uL (ref 150–400)
RBC: 3.01 MIL/uL — ABNORMAL LOW (ref 3.87–5.11)
RDW: 12 % (ref 11.5–15.5)
WBC: 10.9 10*3/uL — ABNORMAL HIGH (ref 4.0–10.5)
nRBC: 0 % (ref 0.0–0.2)

## 2023-02-25 MED ORDER — FERROUS SULFATE 325 (65 FE) MG PO TBEC: 325.0000 mg | DELAYED_RELEASE_TABLET | ORAL | 1 refills | Status: DC

## 2023-02-25 NOTE — MAU Provider Note (Signed)
History     CSN: [id number]  Arrival date and time: 02/24/23 2315   Event Date/Time   First Provider Initiated Contact with Patient 02/25/23 0010      Chief Complaint  Patient presents with   Dizziness   Near Syncope    Rachel Berry is a 42 y.o. [id number] at [redacted]w[redacted]d who receives care at [facility].  Patient reports next appt Oct 21st. She presents today for dizziness and near syncope. Patient reports she was laying in bed (with head elevated) and felt dizziness with ringing in her ears.  Patient reports she has experienced this before, but not with pregnancy.  She states the symptoms lasted 4-5 minutes.  She endorses fetal movement x 2.  Patient denies vaginal concerns or abdominal cramping/contractions.    Rice, Meat, Fruit [employer]  2 glasses of water Tea  OB History     Gravida  4   Para  2   Term  2   Preterm  0   AB  1   Living  2      SAB  1   IAB  0   Ectopic  0   Multiple  0   Live Births  2           Past Medical History:  Diagnosis Date   Complication of anesthesia    itching   Medical history non-contributory    Missed ab     Past Surgical History:  Procedure Laterality Date   CESAREAN SECTION N/A 12/19/2014   Procedure: CESAREAN SECTION;  Surgeon: Edwinna Areola, DO;  Location: [facility];  Service: Obstetrics;  Laterality: N/A;   CESAREAN SECTION N/A 12/03/2017   Procedure: REPEAT CESAREAN SECTION;  Surgeon: Edwinna Areola, DO;  Location: [facility];  Service: Obstetrics;  Laterality: N/A;  Doctor Request RNFA   DILATION AND CURETTAGE OF UTERUS N/A 10/30/2013   Procedure: DILATATION AND CURETTAGE;  Surgeon: Oliver Pila, MD;  Location: [facility];  Service: Gynecology;  Laterality: N/A;    History reviewed. No pertinent family history.  Social History   Tobacco Use   Smoking status: Never   Smokeless tobacco: Never  Substance Use Topics   Alcohol use: No   Drug use: No     Allergies:  Allergies  Allergen Reactions   Tamiflu [Oseltamivir Phosphate] Rash    Medications Prior to Admission  Medication Sig Dispense Refill Last Dose   Prenatal Vit-Fe Fumarate-FA (MULTIVITAMIN-PRENATAL) 27-0.8 MG TABS tablet Take 1 tablet by mouth daily at 12 noon.   02/24/2023 at 1500   sertraline (ZOLOFT) 50 MG tablet sertraline 50 mg tablet  TAKE 1 TABLET BY MOUTH EVERY 12 HOURS   02/24/2023 at 1200   hyoscyamine (LEVSIN SL) 0.125 MG SL tablet Place 1 tablet (0.125 mg total) under the tongue every 6 (six) hours as needed. 30 tablet 2    ibuprofen (ADVIL,MOTRIN) 600 MG tablet Take 1 tablet (600 mg total) by mouth every 6 (six) hours. 30 tablet 0     Review of Systems  Constitutional:  Negative for chills and fever.  Genitourinary:  Negative for difficulty urinating, dysuria, vaginal bleeding and vaginal discharge.  Neurological:  Positive for dizziness and light-headedness. Negative for headaches.   Physical Exam   Blood pressure 108/67, pulse 87, resp. rate 17, SpO2 100%, unknown if currently breastfeeding.  Physical Exam Vitals reviewed.  Constitutional:      Appearance: Normal appearance.  HENT:     Head: Normocephalic  and atraumatic.  Eyes:     Conjunctiva/sclera: Conjunctivae normal.  Cardiovascular:     Rate and Rhythm: Normal rate.  Pulmonary:     Effort: Pulmonary effort is normal. No respiratory distress.  Abdominal:     Palpations: Abdomen is soft.     Tenderness: There is no abdominal tenderness.     Comments: Gravid, Appears LGA  Musculoskeletal:        General: Normal range of motion.     Cervical back: Normal range of motion.  Skin:    General: Skin is warm and dry.  Neurological:     Mental Status: She is alert and oriented to person, place, and time.  Psychiatric:        Mood and Affect: Mood normal.        Behavior: Behavior normal.     MAU Course  Procedures Results for orders placed or performed during the hospital encounter of  02/24/23 (from the past 24 hour(s))  Glucose, capillary     Status: None   Collection Time: 02/24/23 11:49 PM  Result Value Ref Range   Glucose-Capillary 92 70 - 99 mg/dL  Urinalysis, Routine w reflex microscopic -Urine, Clean Catch     Status: Abnormal   Collection Time: 02/25/23 12:08 AM  Result Value Ref Range   Color, Urine STRAW (A) YELLOW   APPearance CLEAR CLEAR   Specific Gravity, Urine 1.004 (L) 1.005 - 1.030   pH 6.0 5.0 - 8.0   Glucose, UA NEGATIVE NEGATIVE mg/dL   Hgb urine dipstick NEGATIVE NEGATIVE   Bilirubin Urine NEGATIVE NEGATIVE   Ketones, ur NEGATIVE NEGATIVE mg/dL   Protein, ur NEGATIVE NEGATIVE mg/dL   Nitrite NEGATIVE NEGATIVE   Leukocytes,Ua NEGATIVE NEGATIVE  Basic metabolic panel     Status: Abnormal   Collection Time: 02/25/23 12:26 AM  Result Value Ref Range   Sodium 133 (L) 135 - 145 mmol/L   Potassium 3.3 (L) 3.5 - 5.1 mmol/L   Chloride 104 98 - 111 mmol/L   CO2 20 (L) 22 - 32 mmol/L   Glucose, Bld 120 (H) 70 - 99 mg/dL   BUN 9 6 - 20 mg/dL   Creatinine, Ser 6.26 0.44 - 1.00 mg/dL   Calcium 8.6 (L) 8.9 - 10.3 mg/dL   GFR, Estimated >94 >85 mL/min   Anion gap 9 5 - 15  CBC     Status: Abnormal   Collection Time: 02/25/23 12:26 AM  Result Value Ref Range   WBC 10.9 (H) 4.0 - 10.5 K/uL   RBC 3.01 (L) 3.87 - 5.11 MIL/uL   Hemoglobin 8.5 (L) 12.0 - 15.0 g/dL   HCT 46.2 (L) 70.3 - 50.0 %   MCV 84.1 80.0 - 100.0 fL   MCH 28.2 26.0 - 34.0 pg   MCHC 33.6 30.0 - 36.0 g/dL   RDW 93.8 18.2 - 99.3 %   Platelets 267 150 - 400 K/uL   nRBC 0.0 0.0 - 0.2 %    Patient informed that the ultrasound is considered a limited OB ultrasound and is not intended to be a complete ultrasound exam.  Patient also informed that the ultrasound is not being completed with the intent of assessing for fetal or placental anomalies or any pelvic abnormalities.  Explained that the purpose of today's ultrasound is to assess for   fetal heart rate .  Patient acknowledges the  purpose of the exam and the limitations of the study. Twin IUP noted with BA HR of 152 and BB  HR of 162. Good fetal movement x 2 noted.   MDM Labs: CBC, CMP, CBG EKG EFM Prescription Assessment and Plan  42 year old, [id number]  TIUP at 22.1  weeks Near Syncope  -EKG completed and returns normal.  -CBG completed and normal.  -Reviewed POC with patient. -Exam performed and findings discussed.  -BSUS completed for FHR. -Discussed positioning and intake that can contribute to symptoms. Discouraged laying flat on back and increasing protein in diet.  -Discussed obtaining labs to review hemoglobin and electrolytes. -Patient agreeable. -Monitor and await results.   Cherre Robins 02/25/2023, 12:10 AM   Reassessment (1:14 AM) -Results return as above. -Reviewed iron level and recommendation for supplement. -Patient agreeable. Rx sent to pharmacy on file.  -Discussed symptoms associated with anemia and cautioned that future interventions may be necessary including iron and/or blood transfusion if supplements not effective.  -Patient verbalizes understanding. -Instructed to follow up as scheduled. -Patient requests and given OOW note for Monday and Tuesday.  -Encouraged to call primary office or return to MAU if symptoms worsen or with the onset of new symptoms. -Discharged to home in stable condition.  Cherre Robins MSN, CNM Advanced Practice Provider, [facility] [employer]

## 2023-03-05 ENCOUNTER — Non-Acute Institutional Stay (HOSPITAL_COMMUNITY)
Admission: RE | Admit: 2023-03-05 | Discharge: 2023-03-05 | Disposition: A | Discharge status: Home / Self Care | Payer: Managed Care, Other (non HMO) | Source: Ambulatory Visit | Attending: Internal Medicine | Admitting: Internal Medicine

## 2023-03-05 DIAGNOSIS — O99012 Anemia complicating pregnancy, second trimester: Secondary | ICD-10-CM | POA: Diagnosis present

## 2023-03-05 MED ORDER — SODIUM CHLORIDE 0.9 % IV SOLN
510.0000 mg | Freq: Once | INTRAVENOUS | Status: AC
  Administered 2023-03-05: 510 mg via INTRAVENOUS
  Filled 2023-03-05: qty 17

## 2023-03-05 MED ORDER — SODIUM CHLORIDE 0.9 % IV SOLN: INTRAVENOUS | Status: DC | PRN

## 2023-03-05 NOTE — Progress Notes (Signed)
PATIENT CARE CENTER NOTE  Diagnosis: D64.9   Provider: Truett Perna, DO  Procedure: Feraheme 510 mg (dose #1 of 2)   Note: Patient came to clinic today to receive Feraheme 510 mg via PIV. No pre-medication per orders. A few minutes after IV Feraheme 510 mg was started, pt called out reporting feeling hot all over her body, and felt like it was going up to her throat. Pts face appeared flushed and red. Iron infusion stopped, and vital signs obtained. Vital signs are stable. Pt reports that her symptoms are resolving. RN called [employer] and spoke with [employer]. Per ordering provider pt can receive 50 mg PO Benadryl. Per provider pt should start taking over the counter Ferrous Sulfate every other day, and they will recheck iron level in one month. Pt has an appointment with ordering provider this afternoon. Pt advised. Pt declines the Benadryl, and says that her symptoms have resolved. Pt is observed for 30 minutes after the infusion was stopped. Vital signs remain stable. AVS printed and given to pt. Pt is alert, oriented, and ambulatory at discharge. Pt is discharged home with her spouse.

## 2023-03-12 ENCOUNTER — Encounter (HOSPITAL_COMMUNITY): Payer: 59

## 2023-06-04 ENCOUNTER — Encounter (HOSPITAL_COMMUNITY): Payer: Self-pay

## 2023-06-04 NOTE — Patient Instructions (Signed)
Rachel Berry  06/04/2023   Your procedure is scheduled on:  06/17/2023  Arrive at 0730 at Entrance C on [address] at [facility]  and [address]. You are invited to use the FREE valet parking or use the Visitor's parking deck.  Pick up the phone at the desk and dial [phone].  Call this number if you have problems the morning of surgery: [phone]  Remember:   Do not eat food:(After Midnight) Desps de medianoche.  You may drink clear liquids until arrival at __0730___.  Clear liquids means a liquid you can see thru.  It can have color such as Cola or Kool aid.  Tea is OK and coffee as long as no milk or creamer of any kind.  Take these medicines the morning of surgery with A SIP OF WATER:  Take zoloft as prescribed   Do not wear jewelry, make-up or nail polish.  Do not wear lotions, powders, or perfumes. Do not wear deodorant.  Do not shave 48 hours prior to surgery.  Do not bring valuables to the hospital.  [facility] is not   responsible for any belongings or valuables brought to the hospital.  Contacts, dentures or bridgework may not be worn into surgery.  Leave suitcase in the car. After surgery it may be brought to your room.  For patients admitted to the hospital, checkout time is 11:00 AM the day of              discharge.      Please read over the following fact sheets that you were given:     Preparing for Surgery

## 2023-06-09 ENCOUNTER — Other Ambulatory Visit: Payer: Self-pay

## 2023-06-09 DIAGNOSIS — O328XX2 Maternal care for other malpresentation of fetus, fetus 2: Secondary | ICD-10-CM | POA: Diagnosis present

## 2023-06-09 DIAGNOSIS — Z3A37 37 weeks gestation of pregnancy: Secondary | ICD-10-CM

## 2023-06-09 DIAGNOSIS — Z98891 History of uterine scar from previous surgery: Principal | ICD-10-CM

## 2023-06-09 DIAGNOSIS — Z79899 Other long term (current) drug therapy: Secondary | ICD-10-CM | POA: Diagnosis not present

## 2023-06-09 DIAGNOSIS — O321XX0 Maternal care for breech presentation, not applicable or unspecified: Secondary | ICD-10-CM | POA: Diagnosis not present

## 2023-06-09 DIAGNOSIS — O30043 Twin pregnancy, dichorionic/diamniotic, third trimester: Secondary | ICD-10-CM | POA: Diagnosis present

## 2023-06-09 DIAGNOSIS — F419 Anxiety disorder, unspecified: Secondary | ICD-10-CM | POA: Diagnosis present

## 2023-06-09 DIAGNOSIS — O99344 Other mental disorders complicating childbirth: Secondary | ICD-10-CM | POA: Diagnosis present

## 2023-06-09 DIAGNOSIS — O4292 Full-term premature rupture of membranes, unspecified as to length of time between rupture and onset of labor: Secondary | ICD-10-CM | POA: Diagnosis present

## 2023-06-09 DIAGNOSIS — O34211 Maternal care for low transverse scar from previous cesarean delivery: Secondary | ICD-10-CM | POA: Diagnosis present

## 2023-06-09 DIAGNOSIS — O4202 Full-term premature rupture of membranes, onset of labor within 24 hours of rupture: Secondary | ICD-10-CM | POA: Diagnosis not present

## 2023-06-09 MED ORDER — SODIUM CHLORIDE 0.9 % IV SOLN
500.0000 mg | INTRAVENOUS | Status: AC
  Filled 2023-06-09: qty 5

## 2023-06-09 MED ORDER — SOD CITRATE-CITRIC ACID 500-334 MG/5ML PO SOLN
30.0000 mL | ORAL | Status: AC
  Filled 2023-06-09: qty 30

## 2023-06-09 MED ORDER — CEFAZOLIN SODIUM-DEXTROSE 2-4 GM/100ML-% IV SOLN: 2.0000 g | INTRAVENOUS | Status: AC

## 2023-06-10 ENCOUNTER — Encounter (HOSPITAL_COMMUNITY)
Admission: AD | Disposition: A | Discharge status: Home / Self Care | Payer: Self-pay | Source: Home / Self Care | Attending: Obstetrics and Gynecology

## 2023-06-10 ENCOUNTER — Inpatient Hospital Stay (HOSPITAL_COMMUNITY): Payer: Managed Care, Other (non HMO) | Admitting: Anesthesiology

## 2023-06-10 ENCOUNTER — Encounter (HOSPITAL_COMMUNITY): Payer: Self-pay | Admitting: Obstetrics and Gynecology

## 2023-06-10 DIAGNOSIS — O30043 Twin pregnancy, dichorionic/diamniotic, third trimester: Secondary | ICD-10-CM

## 2023-06-10 DIAGNOSIS — Z3A37 37 weeks gestation of pregnancy: Secondary | ICD-10-CM

## 2023-06-10 DIAGNOSIS — O34211 Maternal care for low transverse scar from previous cesarean delivery: Secondary | ICD-10-CM

## 2023-06-10 DIAGNOSIS — O99344 Other mental disorders complicating childbirth: Secondary | ICD-10-CM

## 2023-06-10 DIAGNOSIS — O321XX0 Maternal care for breech presentation, not applicable or unspecified: Secondary | ICD-10-CM

## 2023-06-10 DIAGNOSIS — O4202 Full-term premature rupture of membranes, onset of labor within 24 hours of rupture: Secondary | ICD-10-CM

## 2023-06-10 SURGERY — Surgical Case
Anesthesia: Spinal

## 2023-06-10 MED ORDER — METOCLOPRAMIDE HCL 5 MG/ML IJ SOLN
INTRAMUSCULAR | Status: AC
  Filled 2023-06-10: qty 2

## 2023-06-10 MED ORDER — LACTATED RINGERS IV SOLN: INTRAVENOUS | Status: AC

## 2023-06-10 MED ORDER — PHENYLEPHRINE HCL-NACL 20-0.9 MG/250ML-% IV SOLN: INTRAVENOUS | Status: DC | PRN

## 2023-06-10 MED ORDER — MORPHINE SULFATE (PF) 0.5 MG/ML IJ SOLN: INTRAMUSCULAR | Status: DC | PRN

## 2023-06-10 MED ORDER — DIPHENHYDRAMINE HCL 25 MG PO CAPS: 25.0000 mg | ORAL_CAPSULE | Freq: Four times a day (QID) | ORAL | Status: DC | PRN

## 2023-06-10 MED ORDER — BUPIVACAINE IN DEXTROSE 0.75-8.25 % IT SOLN: INTRATHECAL | Status: DC | PRN

## 2023-06-10 MED ORDER — COCONUT OIL OIL: 1.0000 | TOPICAL_OIL | Status: DC | PRN

## 2023-06-10 MED ORDER — HYDROMORPHONE HCL 1 MG/ML IJ SOLN: 0.2000 mg | INTRAMUSCULAR | Status: DC | PRN

## 2023-06-10 MED ORDER — DEXMEDETOMIDINE HCL IN NACL 200 MCG/50ML IV SOLN: INTRAVENOUS | Status: DC | PRN

## 2023-06-10 MED ORDER — SERTRALINE HCL 50 MG PO TABS
50.0000 mg | ORAL_TABLET | Freq: Every day | ORAL | Status: DC
  Filled 2023-06-10 (×2): qty 1

## 2023-06-10 MED ORDER — ACETAMINOPHEN 10 MG/ML IV SOLN: INTRAVENOUS | Status: DC | PRN

## 2023-06-10 MED ORDER — TRANEXAMIC ACID-NACL 1000-0.7 MG/100ML-% IV SOLN: INTRAVENOUS | Status: DC | PRN

## 2023-06-10 MED ORDER — MEPERIDINE HCL 25 MG/ML IJ SOLN: 6.2500 mg | INTRAMUSCULAR | Status: DC | PRN

## 2023-06-10 MED ORDER — MORPHINE SULFATE (PF) 0.5 MG/ML IJ SOLN
INTRAMUSCULAR | Status: AC
  Filled 2023-06-10: qty 10

## 2023-06-10 MED ORDER — DEXAMETHASONE SODIUM PHOSPHATE 10 MG/ML IJ SOLN
INTRAMUSCULAR | Status: AC
  Filled 2023-06-10: qty 1

## 2023-06-10 MED ORDER — OXYCODONE HCL 5 MG/5ML PO SOLN: 5.0000 mg | Freq: Once | ORAL | Status: DC | PRN

## 2023-06-10 MED ORDER — FENTANYL CITRATE (PF) 100 MCG/2ML IJ SOLN: INTRAMUSCULAR | Status: DC | PRN

## 2023-06-10 MED ORDER — IBUPROFEN 600 MG PO TABS: 600.0000 mg | ORAL_TABLET | Freq: Four times a day (QID) | ORAL | Status: DC

## 2023-06-10 MED ORDER — LACTATED RINGERS IV SOLN: INTRAVENOUS | Status: DC | PRN

## 2023-06-10 MED ORDER — OXYTOCIN-SODIUM CHLORIDE 30-0.9 UT/500ML-% IV SOLN
INTRAVENOUS | Status: AC
  Filled 2023-06-10: qty 500

## 2023-06-10 MED ORDER — MELATONIN 3 MG PO TABS
3.0000 mg | ORAL_TABLET | Freq: Every evening | ORAL | Status: DC | PRN
  Filled 2023-06-10: qty 1

## 2023-06-10 MED ORDER — TRANEXAMIC ACID-NACL 1000-0.7 MG/100ML-% IV SOLN
INTRAVENOUS | Status: AC
  Filled 2023-06-10: qty 100

## 2023-06-10 MED ORDER — OXYTOCIN-SODIUM CHLORIDE 30-0.9 UT/500ML-% IV SOLN: INTRAVENOUS | Status: DC | PRN

## 2023-06-10 MED ORDER — ONDANSETRON HCL 4 MG/2ML IJ SOLN: INTRAMUSCULAR | Status: DC | PRN

## 2023-06-10 MED ORDER — SIMETHICONE 80 MG PO CHEW: 80.0000 mg | CHEWABLE_TABLET | ORAL | Status: DC | PRN

## 2023-06-10 MED ORDER — OXYCODONE HCL 5 MG PO TABS
5.0000 mg | ORAL_TABLET | ORAL | Status: DC | PRN
  Filled 2023-06-10: qty 1

## 2023-06-10 MED ORDER — OXYTOCIN-SODIUM CHLORIDE 30-0.9 UT/500ML-% IV SOLN: 2.5000 [IU]/h | INTRAVENOUS | Status: AC

## 2023-06-10 MED ORDER — ZOLPIDEM TARTRATE 5 MG PO TABS: 5.0000 mg | ORAL_TABLET | Freq: Every evening | ORAL | Status: DC | PRN

## 2023-06-10 MED ORDER — SODIUM CHLORIDE 0.9 % IV SOLN: INTRAVENOUS | Status: DC | PRN

## 2023-06-10 MED ORDER — IBUPROFEN 600 MG PO TABS
600.0000 mg | ORAL_TABLET | Freq: Four times a day (QID) | ORAL | Status: DC
  Administered 2023-06-13 (×3): 600 mg via ORAL
  Filled 2023-06-10 (×13): qty 1

## 2023-06-10 MED ORDER — KETOROLAC TROMETHAMINE 30 MG/ML IJ SOLN
30.0000 mg | Freq: Four times a day (QID) | INTRAMUSCULAR | Status: DC
  Filled 2023-06-10: qty 1

## 2023-06-10 MED ORDER — FENTANYL CITRATE (PF) 100 MCG/2ML IJ SOLN
INTRAMUSCULAR | Status: AC
  Filled 2023-06-10: qty 2

## 2023-06-10 MED ORDER — FENTANYL CITRATE (PF) 100 MCG/2ML IJ SOLN: 25.0000 ug | INTRAMUSCULAR | Status: DC | PRN

## 2023-06-10 MED ORDER — WITCH HAZEL-GLYCERIN EX PADS: 1.0000 | MEDICATED_PAD | CUTANEOUS | Status: DC | PRN

## 2023-06-10 MED ORDER — STERILE WATER FOR IRRIGATION IR SOLN
Status: DC | PRN
  Administered 2023-06-10: 1000 mL

## 2023-06-10 MED ORDER — ALPRAZOLAM 0.5 MG PO TABS
0.5000 mg | ORAL_TABLET | Freq: Three times a day (TID) | ORAL | Status: DC | PRN
  Administered 2023-06-13: 0.5 mg via ORAL
  Filled 2023-06-10 (×7): qty 1

## 2023-06-10 MED ORDER — TETANUS-DIPHTH-ACELL PERTUSSIS 5-2.5-18.5 LF-MCG/0.5 IM SUSY: 0.5000 mL | PREFILLED_SYRINGE | Freq: Once | INTRAMUSCULAR | Status: DC

## 2023-06-10 MED ORDER — SENNOSIDES-DOCUSATE SODIUM 8.6-50 MG PO TABS
2.0000 | ORAL_TABLET | ORAL | Status: DC
  Administered 2023-06-13: 2 via ORAL
  Filled 2023-06-10 (×5): qty 2

## 2023-06-10 MED ORDER — MENTHOL 3 MG MT LOZG: 1.0000 | LOZENGE | OROMUCOSAL | Status: DC | PRN

## 2023-06-10 MED ORDER — METOCLOPRAMIDE HCL 5 MG/ML IJ SOLN: INTRAMUSCULAR | Status: DC | PRN

## 2023-06-10 MED ORDER — SODIUM CHLORIDE 0.9 % IR SOLN
Status: DC | PRN
  Administered 2023-06-10: 1

## 2023-06-10 MED ORDER — DEXMEDETOMIDINE HCL IN NACL 80 MCG/20ML IV SOLN
INTRAVENOUS | Status: AC
  Filled 2023-06-10: qty 20

## 2023-06-10 MED ORDER — ONDANSETRON HCL 4 MG/2ML IJ SOLN
INTRAMUSCULAR | Status: AC
  Filled 2023-06-10: qty 2

## 2023-06-10 MED ORDER — DIPHENHYDRAMINE HCL 50 MG/ML IJ SOLN: INTRAMUSCULAR | Status: DC | PRN

## 2023-06-10 MED ORDER — DEXAMETHASONE SODIUM PHOSPHATE 10 MG/ML IJ SOLN: INTRAMUSCULAR | Status: DC | PRN

## 2023-06-10 MED ORDER — DIBUCAINE (PERIANAL) 1 % EX OINT: 1.0000 | TOPICAL_OINTMENT | CUTANEOUS | Status: DC | PRN

## 2023-06-10 MED FILL — Simethicone Chew Tab 80 MG: 80.0000 mg | ORAL | Qty: 1 | Status: AC

## 2023-06-10 MED FILL — Simethicone Chew Tab 80 MG: 80.0000 mg | ORAL | Qty: 1 | Status: CN

## 2023-06-10 MED FILL — Acetaminophen Tab 500 MG: 1000.0000 mg | ORAL | Qty: 2 | Status: AC

## 2023-06-10 SURGICAL SUPPLY — 29 items
BENZOIN TINCTURE PRP APPL 2/3 (GAUZE/BANDAGES/DRESSINGS) ×1 IMPLANT
CHLORAPREP W/TINT 26 (MISCELLANEOUS) ×2 IMPLANT
CLAMP UMBILICAL CORD (MISCELLANEOUS) ×1 IMPLANT
CLOTH BEACON ORANGE TIMEOUT ST (SAFETY) ×1 IMPLANT
DRSG OPSITE POSTOP 4X10 (GAUZE/BANDAGES/DRESSINGS) ×1 IMPLANT
ELECT REM PT RETURN 9FT ADLT (ELECTROSURGICAL) ×1
ELECTRODE REM PT RTRN 9FT ADLT (ELECTROSURGICAL) ×1 IMPLANT
EXTRACTOR VACUUM BELL CUP MITY (SUCTIONS) IMPLANT
GAUZE PAD ABD 7.5X8 STRL (GAUZE/BANDAGES/DRESSINGS) IMPLANT
GAUZE SPONGE 4X4 12PLY STRL LF (GAUZE/BANDAGES/DRESSINGS) IMPLANT
GLOVE BIO SURGEON STRL SZ 6 (GLOVE) ×1 IMPLANT
GLOVE BIOGEL PI IND STRL 6.5 (GLOVE) ×1 IMPLANT
GOWN STRL REUS W/TWL LRG LVL3 (GOWN DISPOSABLE) ×2 IMPLANT
KIT ABG SYR 3ML LUER SLIP (SYRINGE) ×1 IMPLANT
NDL HYPO 25X5/8 SAFETYGLIDE (NEEDLE) ×1 IMPLANT
NEEDLE HYPO 25X5/8 SAFETYGLIDE (NEEDLE) ×1 IMPLANT
NS IRRIG 1000ML POUR BTL (IV SOLUTION) ×1 IMPLANT
PACK C SECTION WH (CUSTOM PROCEDURE TRAY) ×1 IMPLANT
PAD OB MATERNITY 4.3X12.25 (PERSONAL CARE ITEMS) ×1 IMPLANT
RTRCTR C-SECT PINK 25CM LRG (MISCELLANEOUS) ×1 IMPLANT
STRIP CLOSURE SKIN 1/2X4 (GAUZE/BANDAGES/DRESSINGS) ×1 IMPLANT
SUT MNCRL 0 VIOLET CTX 36 (SUTURE) ×2 IMPLANT
SUT VIC AB 0 CT1 36 (SUTURE) ×2 IMPLANT
SUT VIC AB 3-0 CT1 TAPERPNT 27 (SUTURE) ×1 IMPLANT
SUT VIC AB 4-0 KS 27 (SUTURE) ×1 IMPLANT
SUT VICRYL+ 3-0 36IN CT-1 (SUTURE) IMPLANT
TOWEL OR 17X24 6PK STRL BLUE (TOWEL DISPOSABLE) ×1 IMPLANT
TRAY FOLEY W/BAG SLVR 14FR LF (SET/KITS/TRAYS/PACK) ×1 IMPLANT
WATER STERILE IRR 1000ML POUR (IV SOLUTION) ×1 IMPLANT

## 2023-06-13 ENCOUNTER — Encounter (HOSPITAL_COMMUNITY): Payer: Self-pay

## 2023-06-13 MED ORDER — ALPRAZOLAM 0.5 MG PO TABS: 0.5000 mg | ORAL_TABLET | Freq: Three times a day (TID) | ORAL | 0 refills | Status: AC | PRN

## 2023-06-13 MED ORDER — IBUPROFEN 600 MG PO TABS: 600.0000 mg | ORAL_TABLET | Freq: Four times a day (QID) | ORAL | 0 refills | Status: AC

## 2023-06-13 MED ORDER — OXYCODONE HCL 5 MG PO TABS: 5.0000 mg | ORAL_TABLET | Freq: Four times a day (QID) | ORAL | 0 refills | Status: AC | PRN

## 2023-06-13 MED ADMIN — Acetaminophen Tab 500 MG: 1000 mg | ORAL | NDC 50580045711

## 2023-06-13 MED ADMIN — Simethicone Chew Tab 80 MG: 80 mg | ORAL | NDC 77333081225

## 2023-06-13 NOTE — Progress Notes (Signed)
Patient reports seeing yellow floaters; denies blurred vision, headache, and Epigastric pain. All vital signs WNL. ( BP 121/84 Pulse 79 Temp 97.7 and Oxygen sat 99% 06/13/2023 at 1235) RN notified Dr Chestine Spore. At this time Dr. Chestine Spore sees no postpartum concerns or barriers to discharge. Patient to call Ophthalmologist if continues.

## 2023-06-13 NOTE — Discharge Summary (Signed)
Postpartum Discharge Summary  Date of Service updated 06/13/2023     Patient Name: Rachel Berry DOB: April 12, 1981 MRN: [id number]  Date of admission: 06/09/2023 Delivery date:   Rachel Berry [[id number]]  06/10/2023    Rachel Berry [[id number]]  06/10/2023 Delivering provider:    Merryn, Berry [[id number]]  Rachel Berry [[id number]]  Rachel Berry Date of discharge: 06/13/2023  Admitting diagnosis: History of cesarean delivery [Z98.891] Intrauterine pregnancy: [redacted]w[redacted]d     Secondary diagnosis:  Principal Problem:   History of cesarean delivery  Additional problems: Anxiety    Discharge diagnosis: Term Pregnancy Delivered                                              Post partum procedures: n/a Augmentation: N/A Complications: None  Hospital course: Onset of Labor With Unplanned C/S   43 y.o. yo [id number] at [redacted]w[redacted]d was admitted in Active Labor with PROM on 06/09/2023. History of cesarean section x 2 with planned repeat. The patient went for cesarean section due to Elective Repeat. Delivery details as follows: Membrane Rupture Time/Date:    Rachel Berry [[id number]]  10:00 PM    Rachel Berry [[id number]]  10:00 PM,   Rachel Berry [[id number]]  06/09/2023    Rachel Berry [[id number]]  06/09/2023  Delivery Method:   Jordan Hawks [[id number]]  C-Section, Low Transverse    Rachel Berry [[id number]]    Operative Delivery:N/A Details of operation can be found in separate operative note. Patient had a postpartum course complicated by significant anxiety.  Her home zoloft was increased.  Consider re-establishing with therapist postpartum as well as short interval f/u in office for mood check.  She is ambulating,tolerating a regular diet, passing flatus, and urinating well.  Patient is discharged home in stable condition 06/13/23.  Newborn Data: Birth date:   Rachel Berry [[id number]]  06/10/2023    Rachel Berry [[id number]]  06/10/2023 Birth time:   Rachel Berry [[id number]]  1:09 AM    Rachel Berry [[id number]]  1:10 AM Gender:   Rachel Berry [[id number]]  Female    Rachel Berry [[id number]]  Female Living status:   Rachel Berry [[id number]]  [facility]    Rachel Berry [[id number]]    Apgars:   Rachel Berry [[id number]]  [address] [[id number]]  8 ,   Rachel Berry [[id number]]  [address] [[id number]]  10  Weight:   Rachel Berry [[id number]]  2770 g    Rachel Berry [[id number]]  2495 g  Magnesium Sulfate received: No BMZ received: No Rhophylac:No MMR:No T-DaP:Given prenatally Flu: Yes RSV Vaccine received: No Transfusion:No Immunizations administered: There is no immunization history for the selected administration types on file for this patient.  Physical exam  Vitals:   06/12/23 0630 06/12/23 1430 06/12/23 2005 06/13/23 0644  BP: 108/82 108/76 132/84 126/81  Pulse: 60 68 81 78  Resp: 16 18 18 16   Temp: 98.3 F (36.8 C)  97.8 F (36.6 C) 98.3 F (36.8 C)  TempSrc: Oral Oral Oral Oral  SpO2: 100% 99% 99% 100%  Weight:      Height:       General: alert, cooperative, and no distress  Lochia: appropriate Uterine Fundus: firm Incision: Dressing is clean, dry, and intact DVT Evaluation: No evidence of DVT seen on physical exam. Labs: Lab Results  Component Value Date   WBC 15.8 (H) 06/10/2023   HGB 11.8 (L) 06/10/2023   HCT 34.5 (L) 06/10/2023   MCV 85.0 06/10/2023   PLT 147 (L) 06/10/2023       No data to display         Edinburgh Score:    06/13/2023    9:06 AM  Edinburgh Postnatal Depression Scale Screening Tool  I have been able to laugh and see the funny side of things. 1  I have looked forward with enjoyment to things. 1  I have blamed myself unnecessarily when things went wrong. 1  I have  been anxious or worried for no good reason. 3  I have felt scared or panicky for no good reason. 2  Things have been getting on top of me. 2  I have been so unhappy that I have had difficulty sleeping. 1  I have felt sad or miserable. 1  I have been so unhappy that I have been crying. 1  The thought of harming myself has occurred to me. 0  Edinburgh Postnatal Depression Scale Total 13      After visit meds:  Allergies as of 06/13/2023       Reactions   Amoxicillin    Iron    Tamiflu [oseltamivir]         Medication List     TAKE these medications    ALPRAZolam 0.5 MG tablet Commonly known as: XANAX Take 1 tablet (0.5 mg total) by mouth 3 (three) times daily as needed for anxiety.   ibuprofen 600 MG tablet Commonly known as: ADVIL Take 1 tablet (600 mg total) by mouth 4 (four) times daily.   oxyCODONE 5 MG immediate release tablet Commonly known as: Oxy IR/ROXICODONE Take 1 tablet (5 mg total) by mouth every 6 (six) hours as needed for severe pain (pain score 7-10).         Discharge home in stable condition Infant Feeding: Bottle Infant Disposition:home with mother Discharge instruction: per After Visit Summary and Postpartum booklet. Activity: Advance as tolerated. Pelvic rest for 6 weeks.  Diet: routine diet Anticipated Birth Control: Unsure Postpartum Appointment:2 weeks Additional Postpartum F/U: Postpartum Depression checkup Future Appointments:No future appointments. Follow up Visit:  Follow-up Information     Edwinna Areola, DO Follow up in 2 week(s).   Specialty: Obstetrics and Gynecology Contact information: [address] [place] [address] [place] [address] [phone]                     06/13/2023 [facility] Lizabeth Leyden, MD

## 2023-06-13 NOTE — Progress Notes (Signed)
CSW received consult due to score 13 on Edinburgh Depression Screen. CSW met MOB at bedside to complete a mental health assessment and offer support. CSW entered the room, introduced herself and acknowledged that FOB was present. MOB gave CSW verbal permission to speak about anything while FOB was present. CSW explained her role and the reason for the visit. MOB presented as calm, was agreeable to consult and remained engaged throughout encounter.   CSW acknowledged Edinburgh score of 13 and MOB reported anxiety/anxious mood due to delivery. Patient requests a referral to [facility]. Patient verbalizes understanding that the appointment will be virtual. CSW inquired about MOB's mental health history. MOB reported being diagnosed with anxiety and depression 5 years ago. MOB reported PPA with her second child and she reached out for support and increased her dose of Zoloft. MOB reported currently taking Zoloft 50mg  and plans to increase her dose as needed during her PP period. MOB requested support resources once discharged from  the hospital. CSW provided MOB with therapy resources for [place], contacted [facility] representative [place] for inhome services and she will be scheduled with [facility] for virtual therapy visits. MOB reported her supports as FOB and family. CSW provided education regarding Baby Blues vs PMADs and provided MOB with resources for mental health follow up.  CSW encouraged MOB to evaluate her mental health throughout the postpartum period with the use of the New Mom Checklist developed by [facility] as well as the [place] Postnatal Depression Scale and notify a medical professional if symptoms arise. CSW assessed for safety with MOB SI and HI; MOB denied all. CSW did not assess for DV; FOB was present.  CSW asked MOB has she selected a pediatrician for the infant's follow up visits; MOB said [facility] - [place]. MOB reported having all  essential items for the infant including a carseat, bassinet and crib for safe sleeping. CSW provided review of Sudden Infant Death Syndrome (SIDS) precautions.  CSW identifies no further need for intervention and no barriers to discharge at this time.   Enos Fling, Theresia Majors Clinical Social Worker [phone]

## 2023-06-13 NOTE — Progress Notes (Signed)
Subjective: Postpartum Day 3: Cesarean Delivery Patient reports incisional pain, tolerating PO, + flatus, and no problems voiding.  Pain well controlled with oral meds. Baby boy s/p circ. Xanax BID PRN has been helping patient, bottle-feeding. She and Dr. Mindi Slicker decided to increase her zoloft dosing due to her history of worsening anxiety in postpartum period with prior pregnancies  Objective: Vital signs in last 24 hours: Temp:  [97.8 F (36.6 C)-98.3 F (36.8 C)] 98.3 F (36.8 C) (01/29 0644) Pulse Rate:  [68-81] 78 (01/29 0644) Resp:  [16-18] 16 (01/29 0644) BP: (108-132)/(76-84) 126/81 (01/29 0644) SpO2:  [99 %-100 %] 100 % (01/29 0644)  Physical Exam:  General: alert, cooperative, and no distress Lochia: appropriate Uterine Fundus: firm Incision: no significant drainage, c/d/i DVT Evaluation: No evidence of DVT seen on physical exam. No significant calf/ankle edema.  No results for input(s): "HGB", "HCT" in the last 72 hours.   Assessment/Plan: Status post Cesarean section. Doing well postoperatively.  Anxiety:  zoloft was increased to 100 mg daily.  Xanax prn, discussed limiting use.  May re-establish with a therapist.  Short interval f/u in office Meeting all goals.  Discharge to home today.   [facility] Lizabeth Leyden, MD 06/13/2023, 9:42 AM

## 2023-06-15 ENCOUNTER — Encounter (HOSPITAL_COMMUNITY)
Admission: RE | Admit: 2023-06-15 | Discharge: 2023-06-15 | Disposition: A | Discharge status: Home / Self Care | Payer: Managed Care, Other (non HMO) | Source: Ambulatory Visit | Attending: Family Medicine | Admitting: Family Medicine

## 2023-06-15 HISTORY — DX: Other mental disorders complicating the puerperium: O99.345

## 2023-06-15 HISTORY — DX: Other specified anxiety disorders: F41.8

## 2023-06-17 ENCOUNTER — Inpatient Hospital Stay (HOSPITAL_COMMUNITY): Admit: 2023-06-17 | Payer: Managed Care, Other (non HMO) | Admitting: Obstetrics and Gynecology

## 2023-06-17 ENCOUNTER — Encounter (HOSPITAL_COMMUNITY): Payer: Self-pay

## 2023-06-17 DIAGNOSIS — Z98891 History of uterine scar from previous surgery: Secondary | ICD-10-CM

## 2023-06-17 SURGERY — Surgical Case
Anesthesia: Spinal | Laterality: Bilateral

## 2023-06-18 ENCOUNTER — Telehealth (HOSPITAL_COMMUNITY): Payer: Self-pay

## 2023-06-18 ENCOUNTER — Telehealth (HOSPITAL_COMMUNITY): Payer: Self-pay | Admitting: *Deleted

## 2023-06-18 NOTE — Telephone Encounter (Signed)
06/18/2023 1133  Name: Rachel Berry MRN: [id number] DOB: Apr 27, 1981  Reason for Call:  Transition of Care Hospital Discharge Call  Contact Status: Patient Contact Status:  (Patients support person answered phone call and states that patient is asleep. Will attempt call at a later time.)  Language assistant needed:          Follow-Up Questions:    Inocente Salles Postnatal Depression Scale:  In the Past 7 Days:    PHQ2-9 Depression Scale:     Discharge Follow-up:    Post-discharge interventions: NA  Signature  Signe Colt

## 2023-06-18 NOTE — Telephone Encounter (Signed)
06/18/2023  Name: Rachel Berry MRN: [id number] DOB: April 13, 1981  Reason for Call:  Transition of Care Hospital Discharge Call  Contact Status: Patient Contact Status: Message  Language assistant needed:          Follow-Up Questions:    Inocente Salles Postnatal Depression Scale:  In the Past 7 Days:    PHQ2-9 Depression Scale:     Discharge Follow-up:    Post-discharge interventions: NA  Salena Saner, RN 06/18/2023 16:38
